# Patient Record
Sex: Male | Born: 1955 | Race: White | Hispanic: No | Marital: Married | Smoking: Never smoker
Health system: Southern US, Community
[De-identification: ages and names within clinical notes are randomized; demographics above are authoritative.]

## PROBLEM LIST (undated history)

## (undated) DIAGNOSIS — N390 Urinary tract infection, site not specified: Secondary | ICD-10-CM

## (undated) DIAGNOSIS — K219 Gastro-esophageal reflux disease without esophagitis: Secondary | ICD-10-CM

## (undated) DIAGNOSIS — D751 Secondary polycythemia: Secondary | ICD-10-CM

## (undated) DIAGNOSIS — E785 Hyperlipidemia, unspecified: Secondary | ICD-10-CM

## (undated) DIAGNOSIS — E0590 Thyrotoxicosis, unspecified without thyrotoxic crisis or storm: Secondary | ICD-10-CM

## (undated) DIAGNOSIS — Z87442 Personal history of urinary calculi: Secondary | ICD-10-CM

## (undated) DIAGNOSIS — B159 Hepatitis A without hepatic coma: Secondary | ICD-10-CM

## (undated) DIAGNOSIS — I10 Essential (primary) hypertension: Secondary | ICD-10-CM

## (undated) HISTORY — DX: Thyrotoxicosis, unspecified without thyrotoxic crisis or storm: E05.90

## (undated) HISTORY — DX: Hyperlipidemia, unspecified: E78.5

## (undated) HISTORY — DX: Gastro-esophageal reflux disease without esophagitis: K21.9

## (undated) HISTORY — DX: Personal history of urinary calculi: Z87.442

## (undated) HISTORY — DX: Secondary polycythemia: D75.1

## (undated) HISTORY — DX: Essential (primary) hypertension: I10

## (undated) HISTORY — DX: Urinary tract infection, site not specified: N39.0

## (undated) HISTORY — DX: Hepatitis a without hepatic coma: B15.9

---

## 1999-03-08 ENCOUNTER — Ambulatory Visit (HOSPITAL_COMMUNITY): Admission: RE | Admit: 1999-03-08 | Discharge: 1999-03-08 | Payer: Self-pay | Admitting: Internal Medicine

## 1999-03-08 ENCOUNTER — Encounter: Payer: Self-pay | Admitting: Internal Medicine

## 1999-03-13 ENCOUNTER — Ambulatory Visit (HOSPITAL_COMMUNITY): Admission: RE | Admit: 1999-03-13 | Discharge: 1999-03-13 | Payer: Self-pay | Admitting: Internal Medicine

## 1999-03-13 ENCOUNTER — Encounter: Payer: Self-pay | Admitting: Internal Medicine

## 1999-03-21 ENCOUNTER — Encounter: Payer: Self-pay | Admitting: Endocrinology

## 1999-03-21 ENCOUNTER — Ambulatory Visit (HOSPITAL_COMMUNITY): Admission: RE | Admit: 1999-03-21 | Discharge: 1999-03-21 | Payer: Self-pay | Admitting: Endocrinology

## 1999-10-17 ENCOUNTER — Encounter (INDEPENDENT_AMBULATORY_CARE_PROVIDER_SITE_OTHER): Payer: Self-pay | Admitting: Specialist

## 1999-10-17 ENCOUNTER — Ambulatory Visit (HOSPITAL_COMMUNITY): Admission: RE | Admit: 1999-10-17 | Discharge: 1999-10-17 | Payer: Self-pay | Admitting: Urology

## 2005-10-27 ENCOUNTER — Ambulatory Visit: Payer: Self-pay | Admitting: Internal Medicine

## 2006-05-07 ENCOUNTER — Ambulatory Visit: Payer: Self-pay | Admitting: Internal Medicine

## 2006-05-12 ENCOUNTER — Ambulatory Visit: Payer: Self-pay | Admitting: Internal Medicine

## 2006-10-12 ENCOUNTER — Encounter (INDEPENDENT_AMBULATORY_CARE_PROVIDER_SITE_OTHER): Payer: Self-pay | Admitting: *Deleted

## 2006-10-12 ENCOUNTER — Ambulatory Visit: Payer: Self-pay | Admitting: Internal Medicine

## 2006-10-12 LAB — CONVERTED CEMR LAB: PSA: 0.89 ng/mL

## 2006-11-17 ENCOUNTER — Ambulatory Visit: Payer: Self-pay | Admitting: Internal Medicine

## 2006-11-17 LAB — CONVERTED CEMR LAB
ALT: 33 units/L (ref 0–40)
AST: 36 units/L (ref 0–37)
Albumin: 4.4 g/dL (ref 3.5–5.2)
Alkaline Phosphatase: 65 units/L (ref 39–117)
Bilirubin, Direct: 0.2 mg/dL (ref 0.0–0.3)
Chol/HDL Ratio, serum: 4.1
Cholesterol: 148 mg/dL (ref 0–200)
HDL: 36.1 mg/dL — ABNORMAL LOW (ref >39.0)
LDL Cholesterol: 94 mg/dL (ref 0–99)
Total Bilirubin: 1.3 mg/dL — ABNORMAL HIGH (ref 0.3–1.2)
Total CK: 379 units/L (ref 7–195)
Total Protein: 7.6 g/dL (ref 6.0–8.3)
Triglyceride fasting, serum: 90 mg/dL (ref 0–149)
VLDL: 18 mg/dL (ref 0–40)

## 2007-07-29 ENCOUNTER — Encounter: Payer: Self-pay | Admitting: *Deleted

## 2007-07-29 DIAGNOSIS — E0590 Thyrotoxicosis, unspecified without thyrotoxic crisis or storm: Secondary | ICD-10-CM

## 2007-07-29 DIAGNOSIS — I10 Essential (primary) hypertension: Secondary | ICD-10-CM | POA: Insufficient documentation

## 2007-07-29 DIAGNOSIS — E785 Hyperlipidemia, unspecified: Secondary | ICD-10-CM | POA: Insufficient documentation

## 2007-07-29 DIAGNOSIS — K219 Gastro-esophageal reflux disease without esophagitis: Secondary | ICD-10-CM

## 2007-07-29 DIAGNOSIS — B159 Hepatitis A without hepatic coma: Secondary | ICD-10-CM | POA: Insufficient documentation

## 2007-07-29 HISTORY — DX: Thyrotoxicosis, unspecified without thyrotoxic crisis or storm: E05.90

## 2007-07-29 HISTORY — DX: Hepatitis a without hepatic coma: B15.9

## 2007-07-29 HISTORY — DX: Hyperlipidemia, unspecified: E78.5

## 2007-07-29 HISTORY — DX: Gastro-esophageal reflux disease without esophagitis: K21.9

## 2007-07-29 HISTORY — DX: Essential (primary) hypertension: I10

## 2008-05-16 ENCOUNTER — Telehealth: Payer: Self-pay | Admitting: Internal Medicine

## 2008-05-17 ENCOUNTER — Ambulatory Visit: Payer: Self-pay | Admitting: Internal Medicine

## 2008-05-18 LAB — CONVERTED CEMR LAB
ALT: 20 units/L (ref 0–53)
AST: 23 units/L (ref 0–37)
Albumin: 4.4 g/dL (ref 3.5–5.2)
Alkaline Phosphatase: 69 units/L (ref 39–117)
Bilirubin, Direct: 0.1 mg/dL (ref 0.0–0.3)
Cholesterol: 137 mg/dL (ref 0–200)
Direct LDL: 74.6 mg/dL
HDL: 30.4 mg/dL — ABNORMAL LOW (ref >39.0)
PSA: 1.03 ng/mL (ref 0.10–4.00)
Total Bilirubin: 1.1 mg/dL (ref 0.3–1.2)
Total CHOL/HDL Ratio: 4.5
Total Protein: 7.3 g/dL (ref 6.0–8.3)
Triglycerides: 289 mg/dL (ref 0–149)
VLDL: 58 mg/dL — ABNORMAL HIGH (ref 0–40)

## 2009-05-09 LAB — HM COLONOSCOPY

## 2009-08-06 ENCOUNTER — Encounter: Payer: Self-pay | Admitting: Internal Medicine

## 2009-08-28 ENCOUNTER — Telehealth: Payer: Self-pay | Admitting: Internal Medicine

## 2009-08-30 ENCOUNTER — Ambulatory Visit: Payer: Self-pay | Admitting: Internal Medicine

## 2009-08-30 LAB — CONVERTED CEMR LAB
ALT: 19 units/L (ref 0–53)
AST: 20 units/L (ref 0–37)
Albumin: 4.4 g/dL (ref 3.5–5.2)
Alkaline Phosphatase: 59 units/L (ref 39–117)
BUN: 21 mg/dL (ref 6–23)
Basophils Absolute: 0.1 10*3/uL (ref 0.0–0.1)
Basophils Relative: 1.4 % (ref 0.0–3.0)
Bilirubin Urine: NEGATIVE
Bilirubin, Direct: 0.2 mg/dL (ref 0.0–0.3)
CO2: 30 meq/L (ref 19–32)
Calcium: 9 mg/dL (ref 8.4–10.5)
Chloride: 102 meq/L (ref 96–112)
Cholesterol: 209 mg/dL — ABNORMAL HIGH (ref 0–200)
Creatinine, Ser: 1.3 mg/dL (ref 0.4–1.5)
Direct LDL: 164.9 mg/dL
Eosinophils Absolute: 0.1 10*3/uL (ref 0.0–0.7)
Eosinophils Relative: 1.9 % (ref 0.0–5.0)
GFR calc non Af Amer: 61.12 mL/min (ref >60)
Glucose, Bld: 99 mg/dL (ref 70–99)
HCT: 49.6 % (ref 39.0–52.0)
HDL: 31.7 mg/dL — ABNORMAL LOW (ref >39.00)
Hemoglobin: 17.1 g/dL — ABNORMAL HIGH (ref 13.0–17.0)
Ketones, ur: NEGATIVE mg/dL
Leukocytes, UA: NEGATIVE
Lymphocytes Relative: 18.9 % (ref 12.0–46.0)
Lymphs Abs: 1.2 10*3/uL (ref 0.7–4.0)
MCHC: 34.4 g/dL (ref 30.0–36.0)
MCV: 89.5 fL (ref 78.0–100.0)
Monocytes Absolute: 0.5 10*3/uL (ref 0.1–1.0)
Monocytes Relative: 8.2 % (ref 3.0–12.0)
Neutro Abs: 4.4 10*3/uL (ref 1.4–7.7)
Neutrophils Relative %: 69.6 % (ref 43.0–77.0)
Nitrite: NEGATIVE
PSA: 1.29 ng/mL (ref 0.10–4.00)
Platelets: 149 10*3/uL — ABNORMAL LOW (ref 150.0–400.0)
Potassium: 4.1 meq/L (ref 3.5–5.1)
RBC: 5.53 M/uL (ref 4.22–5.81)
RDW: 12.8 % (ref 11.5–14.6)
Sodium: 138 meq/L (ref 135–145)
Specific Gravity, Urine: 1.01 (ref 1.000–1.030)
TSH: 1.55 microintl units/mL (ref 0.35–5.50)
Total Bilirubin: 1.1 mg/dL (ref 0.3–1.2)
Total CHOL/HDL Ratio: 7
Total Protein: 7.2 g/dL (ref 6.0–8.3)
Triglycerides: 89 mg/dL (ref 0.0–149.0)
Urine Glucose: NEGATIVE mg/dL
Urobilinogen, UA: 0.2 (ref 0.0–1.0)
VLDL: 17.8 mg/dL (ref 0.0–40.0)
WBC: 6.3 10*3/uL (ref 4.5–10.5)
pH: 6.5 (ref 5.0–8.0)

## 2009-09-03 ENCOUNTER — Ambulatory Visit: Payer: Self-pay | Admitting: Internal Medicine

## 2009-11-05 ENCOUNTER — Ambulatory Visit: Payer: Self-pay | Admitting: Internal Medicine

## 2010-12-22 ENCOUNTER — Encounter: Payer: Self-pay | Admitting: Urology

## 2011-12-19 ENCOUNTER — Other Ambulatory Visit (HOSPITAL_COMMUNITY): Payer: Self-pay | Admitting: Urology

## 2011-12-19 DIAGNOSIS — R310 Gross hematuria: Secondary | ICD-10-CM

## 2011-12-24 ENCOUNTER — Ambulatory Visit (HOSPITAL_COMMUNITY)
Admission: RE | Admit: 2011-12-24 | Discharge: 2011-12-24 | Disposition: A | Discharge status: Home / Self Care | Payer: Self-pay | Source: Ambulatory Visit | Attending: Urology | Admitting: Urology

## 2011-12-24 DIAGNOSIS — K5730 Diverticulosis of large intestine without perforation or abscess without bleeding: Secondary | ICD-10-CM | POA: Insufficient documentation

## 2011-12-24 DIAGNOSIS — Q619 Cystic kidney disease, unspecified: Secondary | ICD-10-CM | POA: Insufficient documentation

## 2011-12-24 DIAGNOSIS — R310 Gross hematuria: Secondary | ICD-10-CM | POA: Insufficient documentation

## 2011-12-24 DIAGNOSIS — N400 Enlarged prostate without lower urinary tract symptoms: Secondary | ICD-10-CM | POA: Insufficient documentation

## 2011-12-24 MED ORDER — IOHEXOL 300 MG/ML  SOLN
125.0000 mL | Freq: Once | INTRAMUSCULAR | Status: AC | PRN
  Administered 2011-12-24: 125 mL via INTRAVENOUS

## 2013-12-21 ENCOUNTER — Telehealth: Payer: Self-pay

## 2013-12-21 ENCOUNTER — Telehealth: Payer: Self-pay | Admitting: Internal Medicine

## 2013-12-21 DIAGNOSIS — Z0000 Encounter for general adult medical examination without abnormal findings: Secondary | ICD-10-CM

## 2013-12-21 NOTE — Telephone Encounter (Signed)
Ok with me 

## 2013-12-21 NOTE — Telephone Encounter (Signed)
CPX labs entered  

## 2013-12-21 NOTE — Telephone Encounter (Signed)
12/21/2013  Pt [place] with [place] for the past few years and would like to re-establish with Dr. Jenny Reichmann.  Please advise.

## 2013-12-26 ENCOUNTER — Other Ambulatory Visit (INDEPENDENT_AMBULATORY_CARE_PROVIDER_SITE_OTHER): Payer: Self-pay

## 2013-12-26 DIAGNOSIS — Z0000 Encounter for general adult medical examination without abnormal findings: Secondary | ICD-10-CM

## 2013-12-26 LAB — URINALYSIS, ROUTINE W REFLEX MICROSCOPIC
Bilirubin Urine: NEGATIVE
Ketones, ur: NEGATIVE
Leukocytes, UA: NEGATIVE
Nitrite: NEGATIVE
Specific Gravity, Urine: 1.02 (ref 1.000–1.030)
URINE GLUCOSE: NEGATIVE
UROBILINOGEN UA: 0.2 (ref 0.0–1.0)
pH: 7 (ref 5.0–8.0)

## 2013-12-26 LAB — CBC WITH DIFFERENTIAL/PLATELET
BASOS ABS: 0 10*3/uL (ref 0.0–0.1)
Basophils Relative: 0.4 % (ref 0.0–3.0)
Eosinophils Absolute: 0.1 10*3/uL (ref 0.0–0.7)
Eosinophils Relative: 1.5 % (ref 0.0–5.0)
HCT: 51.4 % (ref 39.0–52.0)
Hemoglobin: 17.6 g/dL — ABNORMAL HIGH (ref 13.0–17.0)
LYMPHS ABS: 1.3 10*3/uL (ref 0.7–4.0)
Lymphocytes Relative: 20.7 % (ref 12.0–46.0)
MCHC: 34.2 g/dL (ref 30.0–36.0)
MCV: 87.2 fl (ref 78.0–100.0)
MONOS PCT: 10.5 % (ref 3.0–12.0)
Monocytes Absolute: 0.7 10*3/uL (ref 0.1–1.0)
NEUTROS PCT: 66.9 % (ref 43.0–77.0)
Neutro Abs: 4.2 10*3/uL (ref 1.4–7.7)
PLATELETS: 156 10*3/uL (ref 150.0–400.0)
RBC: 5.9 Mil/uL — ABNORMAL HIGH (ref 4.22–5.81)
RDW: 13.6 % (ref 11.5–14.6)
WBC: 6.3 10*3/uL (ref 4.5–10.5)

## 2013-12-26 LAB — BASIC METABOLIC PANEL
BUN: 20 mg/dL (ref 6–23)
CALCIUM: 9.2 mg/dL (ref 8.4–10.5)
CO2: 30 meq/L (ref 19–32)
Chloride: 103 mEq/L (ref 96–112)
Creatinine, Ser: 1.7 mg/dL — ABNORMAL HIGH (ref 0.4–1.5)
GFR: 45.7 mL/min — ABNORMAL LOW (ref >60.00)
Glucose, Bld: 98 mg/dL (ref 70–99)
Potassium: 4.8 mEq/L (ref 3.5–5.1)
SODIUM: 139 meq/L (ref 135–145)

## 2013-12-26 LAB — LIPID PANEL
CHOL/HDL RATIO: 6
Cholesterol: 213 mg/dL — ABNORMAL HIGH (ref 0–200)
HDL: 37.2 mg/dL — ABNORMAL LOW (ref >39.00)
TRIGLYCERIDES: 104 mg/dL (ref 0.0–149.0)
VLDL: 20.8 mg/dL (ref 0.0–40.0)

## 2013-12-26 LAB — HEPATIC FUNCTION PANEL
ALK PHOS: 63 U/L (ref 39–117)
ALT: 17 U/L (ref 0–53)
AST: 21 U/L (ref 0–37)
Albumin: 4.2 g/dL (ref 3.5–5.2)
BILIRUBIN DIRECT: 0.1 mg/dL (ref 0.0–0.3)
TOTAL PROTEIN: 7.3 g/dL (ref 6.0–8.3)
Total Bilirubin: 1 mg/dL (ref 0.3–1.2)

## 2013-12-26 LAB — LDL CHOLESTEROL, DIRECT: Direct LDL: 157.1 mg/dL

## 2013-12-26 LAB — TSH: TSH: 1.67 u[IU]/mL (ref 0.35–5.50)

## 2013-12-26 LAB — PSA: PSA: 1.31 ng/mL (ref 0.10–4.00)

## 2013-12-29 ENCOUNTER — Encounter: Payer: Self-pay | Admitting: Internal Medicine

## 2013-12-29 ENCOUNTER — Ambulatory Visit (INDEPENDENT_AMBULATORY_CARE_PROVIDER_SITE_OTHER): Payer: Self-pay | Admitting: Internal Medicine

## 2013-12-29 VITALS — BP 164/100 | HR 95 | Temp 98.8°F | Ht 67.0 in | Wt 210.0 lb

## 2013-12-29 DIAGNOSIS — N183 Chronic kidney disease, stage 3 unspecified: Secondary | ICD-10-CM | POA: Insufficient documentation

## 2013-12-29 DIAGNOSIS — N289 Disorder of kidney and ureter, unspecified: Secondary | ICD-10-CM

## 2013-12-29 DIAGNOSIS — D45 Polycythemia vera: Secondary | ICD-10-CM | POA: Insufficient documentation

## 2013-12-29 DIAGNOSIS — Z0000 Encounter for general adult medical examination without abnormal findings: Secondary | ICD-10-CM | POA: Insufficient documentation

## 2013-12-29 DIAGNOSIS — D751 Secondary polycythemia: Secondary | ICD-10-CM

## 2013-12-29 DIAGNOSIS — E785 Hyperlipidemia, unspecified: Secondary | ICD-10-CM

## 2013-12-29 DIAGNOSIS — I10 Essential (primary) hypertension: Secondary | ICD-10-CM

## 2013-12-29 DIAGNOSIS — Z23 Encounter for immunization: Secondary | ICD-10-CM

## 2013-12-29 DIAGNOSIS — Z87442 Personal history of urinary calculi: Secondary | ICD-10-CM

## 2013-12-29 DIAGNOSIS — Z0001 Encounter for general adult medical examination with abnormal findings: Secondary | ICD-10-CM | POA: Insufficient documentation

## 2013-12-29 HISTORY — DX: Secondary polycythemia: D75.1

## 2013-12-29 HISTORY — DX: Personal history of urinary calculi: Z87.442

## 2013-12-29 MED ORDER — ASPIRIN EC 81 MG PO TBEC: 81.0000 mg | DELAYED_RELEASE_TABLET | Freq: Every day | ORAL | Status: DC

## 2013-12-29 MED ORDER — LOVASTATIN 40 MG PO TABS: 40.0000 mg | ORAL_TABLET | Freq: Every day | ORAL | Status: DC

## 2013-12-29 MED ORDER — TELMISARTAN-AMLODIPINE 80-5 MG PO TABS: ORAL_TABLET | ORAL | Status: DC

## 2013-12-29 NOTE — Assessment & Plan Note (Signed)
?   Chronic, recent renal u/s ok in [place] per pt, for fu labs as ordered

## 2013-12-29 NOTE — Addendum Note (Signed)
Addended by: Biagio Borg on: 12/29/2013 02:19 PM   Modules accepted: Level of Service

## 2013-12-29 NOTE — Assessment & Plan Note (Signed)
To start lovastatin 40, f/u labs 4 wks

## 2013-12-29 NOTE — Assessment & Plan Note (Signed)
approx 2015 with steroid tx, resolved

## 2013-12-29 NOTE — Assessment & Plan Note (Signed)

## 2013-12-29 NOTE — Patient Instructions (Addendum)
Please start the Aspirin at 81 mg per day (coated only) Please take all new medication as prescribed - the lovastatin 40 mg per day Please re-start the twynsta 80/5 mg - 1 per day Please return in 4 weeks for repeat kidney and cholesterol testing  You will be contacted regarding the referral for: overnight oximetry (probably per [facility])   Please continue your efforts at being more active, low cholesterol diet, and weight control. You are otherwise up to date with prevention measures today.  Your Lab work was OK today  Please return in 1 year for your yearly visit, or sooner if needed, with Lab testing done 3-5 days before

## 2013-12-29 NOTE — Progress Notes (Signed)
Subjective:    Patient ID: Bradley Riley, male    DOB: 10-Jun-1956, 58 y.o.   MRN: [id number]  HPI  Here for wellness and re-establish;  Overall doing ok;  Pt denies CP, worsening SOB, DOE, wheezing, orthopnea, PND, worsening LE edema, palpitations, dizziness or syncope.  Pt denies neurological change such as new headache, facial or extremity weakness.  Pt denies polydipsia, polyuria, or low sugar symptoms. Pt states overall good compliance with treatment and medications, good tolerability, and has been trying to follow lower cholesterol diet.  Pt denies worsening depressive symptoms, suicidal ideation or panic. No fever, night sweats, wt loss, loss of appetite, or other constitutional symptoms.  Pt states good ability with ADL's, has low fall risk, home safety reviewed and adequate, no other significant changes in hearing or vision, and only occasionally active with exercise.  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  Has been taking twynsta 80/5 qd with BP in [place] < 140/90.  Out of med today for several wks.  Looking in to [facility]. Past Medical History  Diagnosis Date  . Hyperlipidemia   . UTI (lower urinary tract infection)   . Other and unspecified hyperlipidemia 07/29/2007    Centricity Description: DYSLIPIDEMIA Qualifier: Diagnosis of  By: Garen Grams   Centricity Description: HYPERLIPIDEMIA Qualifier: Diagnosis of  By: Garen Grams    . HYPERTHYROIDISM 07/29/2007    Qualifier: Diagnosis of  By: Garen Grams    . HYPERTENSION 07/29/2007    Qualifier: Diagnosis of  By: Garen Grams    . 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 GERD 07/29/2007    Qualifier: Diagnosis of  By: Garen Grams    . History of renal stone 12/29/2013   No past  surgical history on file.  reports that he has never smoked. He does not have any smokeless tobacco history on file. He reports that he does not drink alcohol or use illicit drugs. family history includes Hypertension in his father and mother. No Known Allergies No current outpatient prescriptions on file prior to visit.   No current facility-administered medications on file prior to visit.   Review of Systems Constitutional: Negative for diaphoresis, activity change, appetite change or unexpected weight change.  HENT: Negative for hearing loss, ear pain, facial swelling, mouth sores and neck stiffness.   Eyes: Negative for pain, redness and visual disturbance.  Respiratory: Negative for shortness of breath and wheezing.   Cardiovascular: Negative for chest pain and palpitations.  Gastrointestinal: Negative for diarrhea, blood in stool, abdominal distention or other pain Genitourinary: Negative for hematuria, flank pain or change in urine volume.  Musculoskeletal: Negative for myalgias and joint swelling.  Skin: Negative for color change and wound.  Neurological: Negative for syncope and numbness. other than noted Hematological: Negative for adenopathy.  Psychiatric/Behavioral: Negative for hallucinations, self-injury, decreased concentration and agitation.      Objective:   Physical Exam BP 164/100  Pulse 95  Temp(Src) 98.8 F (37.1 C) (Oral)  Ht 5\' 7"  (1.702 m)  Wt 210 lb (95.255 kg)  BMI 32.88 kg/m2  SpO2 97% VS noted,  Constitutional: Pt is oriented to person, place, and time. Appears well-developed and well-nourished. Annabell Sabal Head: Normocephalic and atraumatic.  Right Ear:  External ear normal.  Left Ear: External ear normal.  Nose: Nose normal.  Mouth/Throat: Oropharynx is clear and moist.  Eyes: Conjunctivae and EOM are normal. Pupils are equal, round, and reactive to light.  Neck: Normal range of motion. Neck supple. No JVD present. No tracheal deviation present.    Cardiovascular: Normal rate, regular rhythm, normal heart sounds and intact distal pulses.   Pulmonary/Chest: Effort normal and breath sounds normal.  Abdominal: Soft. Bowel sounds are normal. There is no tenderness. No HSM  Musculoskeletal: Normal range of motion. Exhibits no edema.  Lymphadenopathy:  Has no cervical adenopathy.  Neurological: Pt is alert and oriented to person, place, and time. Pt has normal reflexes. No cranial nerve deficit.  Skin: Skin is warm and dry. No rash noted.  Psychiatric:  Has  normal mood and affect. Behavior is normal.     Assessment & Plan:

## 2013-12-29 NOTE — Assessment & Plan Note (Signed)
Etiology unclear, for ONO with hx of snoring - r/o nocturnal hypxemia

## 2013-12-29 NOTE — Assessment & Plan Note (Signed)
To re-start , f/u labs in 4 wks - r/o worsening renal fxn

## 2013-12-30 ENCOUNTER — Telehealth: Payer: Self-pay | Admitting: Internal Medicine

## 2013-12-30 MED ORDER — AMLODIPINE BESYLATE 5 MG PO TABS: 5.0000 mg | ORAL_TABLET | Freq: Every day | ORAL | Status: DC

## 2013-12-30 MED ORDER — IRBESARTAN 300 MG PO TABS: 300.0000 mg | ORAL_TABLET | Freq: Every day | ORAL | Status: DC

## 2013-12-30 NOTE — Telephone Encounter (Signed)
Relevant patient education mailed to patient.  

## 2013-12-30 NOTE — Telephone Encounter (Signed)
BP  Med changed to 2 generic

## 2013-12-30 NOTE — Telephone Encounter (Signed)
Pt called request different medication for BP [facility] due to Twynsta 80-5 mg is no in stock for [facility]. Pt request for this to be done today if we can. Please advise.

## 2014-01-02 NOTE — Telephone Encounter (Signed)
The patient called and is in need of clarification regarding this medication.   Callback - [phone]

## 2014-01-02 NOTE — Telephone Encounter (Signed)
Returned patient's phone call & informed him that requested RX change had been sent to his pharmacy at [facility] on [address].

## 2014-01-11 ENCOUNTER — Telehealth: Payer: Self-pay | Admitting: Internal Medicine

## 2014-01-11 DIAGNOSIS — G4734 Idiopathic sleep related nonobstructive alveolar hypoventilation: Secondary | ICD-10-CM

## 2014-01-11 NOTE — Telephone Encounter (Signed)
Overnight oximetry shows significant low oxygen at night, could be possibly related to Sleep Apnea  I have referred to Pulmonary as d/w pt last visit  Robin to inform pt

## 2014-01-11 NOTE — Telephone Encounter (Signed)
Called the patient informed of results and referral.

## 2014-01-18 ENCOUNTER — Ambulatory Visit (INDEPENDENT_AMBULATORY_CARE_PROVIDER_SITE_OTHER): Payer: Self-pay | Admitting: Pulmonary Disease

## 2014-01-18 ENCOUNTER — Encounter: Payer: Self-pay | Admitting: Pulmonary Disease

## 2014-01-18 VITALS — BP 172/112 | HR 96 | Ht 67.0 in | Wt 211.0 lb

## 2014-01-18 DIAGNOSIS — R0683 Snoring: Secondary | ICD-10-CM | POA: Insufficient documentation

## 2014-01-18 DIAGNOSIS — D751 Secondary polycythemia: Secondary | ICD-10-CM

## 2014-01-18 DIAGNOSIS — D45 Polycythemia vera: Secondary | ICD-10-CM

## 2014-01-18 DIAGNOSIS — R0989 Other specified symptoms and signs involving the circulatory and respiratory systems: Secondary | ICD-10-CM

## 2014-01-18 DIAGNOSIS — R0609 Other forms of dyspnea: Secondary | ICD-10-CM

## 2014-01-18 NOTE — Assessment & Plan Note (Signed)
He has snoring, sleep disruption, and daytime sleepiness.  He has history of refractory hypertension, and polycythemia.  Recent overnight oximetry showed significant oxygen desaturation.  I am concerned that he has sleep apnea.  We discussed how sleep apnea can affect various health problems including risks for hypertension, cardiovascular disease, and diabetes.  We also discussed how sleep disruption can increase risks for accident, such as while driving.  Weight loss as a means of improving sleep apnea was also reviewed.  Additional treatment options discussed were CPAP therapy, oral appliance, and surgical intervention.  Will arrange for in lab sleep study to further assess.

## 2014-01-18 NOTE — Patient Instructions (Signed)
Will arrange for sleep study Will call to arrange for follow up after sleep study reviewed 

## 2014-01-18 NOTE — Progress Notes (Signed)
Chief Complaint  Patient presents with  . Sleep Consult    referred by Dr. Jenny Reichmann    History of Present Illness: Bradley Riley is a 58 y.o. male for evaluation of sleep problems.  He was recently seen by his PCP.  Lab work showed hemoglobin of 17.6.  He had overnight oximetry which reportedly showed significant oxygen desaturation.  He also has refractory hypertension.  He snores.  He was referred to pulmonary/sleep for further evaluation.  He goes to sleep at midnight.  He falls asleep after 15 minutes.  He wakes up two times to use the bathroom.  He gets out of bed at 9 am.  He feels tired in the morning.  He denies morning headache.  He does not use anything to help him fall sleep or stay awake.  He is not sure if he stops breathing while asleep.  He will get sleepy and fall asleep when watching TV.  He also gets drowsy if he drives for long distances.  He denies sleep walking, sleep talking, bruxism, or nightmares.  There is no history of restless legs.  He denies sleep hallucinations, sleep paralysis, or cataplexy.  The Epworth score is 12 out of 24.   Fahd Galea Markwardt  has a past medical history of Hyperlipidemia; UTI (lower urinary tract infection); Other and unspecified hyperlipidemia (07/29/2007); HYPERTHYROIDISM (07/29/2007); HYPERTENSION (07/29/2007); HEPATITIS A, VIRAL, W/O HEPATIC COMA (07/29/2007); GERD (07/29/2007); History of renal stone (12/29/2013); and Polycythemia (12/29/2013).  Gunther Zawadzki Pam[place]uan  has no past surgical history on file.  Prior to Admission medications   Medication Sig Start Date End Date Taking? Authorizing Provider  aspirin EC 81 MG tablet Take 1 tablet (81 mg total) by mouth daily. 12/29/13  Yes Biagio Borg, MD  irbesartan (AVAPRO) 300 MG tablet Take 1 tablet (300 mg total) by mouth daily. 12/30/13  Yes Biagio Borg, MD  lovastatin (MEVACOR) 40 MG tablet Take 1 tablet (40 mg total) by mouth at bedtime. 12/29/13  Yes Biagio Borg, MD  Telmisartan-Amlodipine (TWYNSTA) 80-5 MG TABS  Take 1 tablet by mouth at bedtime.   Yes Historical Provider, MD    No Known Allergies  His family history includes Hypertension in his father and mother.  He  reports that he has never smoked. He does not have any smokeless tobacco history on file. He reports that he does not drink alcohol or use illicit drugs.  Review of Systems  Constitutional: Negative for fever, chills, diaphoresis, activity change, appetite change, fatigue and unexpected weight change.  HENT: Negative for congestion, dental problem, ear discharge, ear pain, facial swelling, hearing loss, mouth sores, nosebleeds, postnasal drip, rhinorrhea, sinus pressure, sneezing, sore throat, tinnitus, trouble swallowing and voice change.   Eyes: Negative for photophobia, discharge, itching and visual disturbance.  Respiratory: Negative for apnea, cough, choking, chest tightness, shortness of breath, wheezing and stridor.   Cardiovascular: Negative for chest pain, palpitations and leg swelling.  Gastrointestinal: Negative for nausea, vomiting, abdominal pain, constipation, blood in stool and abdominal distention.  Genitourinary: Negative for dysuria, urgency, frequency, hematuria, flank pain, decreased urine volume and difficulty urinating.  Musculoskeletal: Negative for arthralgias, back pain, gait problem, joint swelling, myalgias, neck pain and neck stiffness.  Skin: Negative for color change, pallor and rash.  Neurological: Negative for dizziness, tremors, seizures, syncope, speech difficulty, weakness, light-headedness, numbness and headaches.  Hematological: Negative for adenopathy. Does not bruise/bleed easily.  Psychiatric/Behavioral: Negative for confusion, sleep disturbance and agitation. The patient is not nervous/anxious.  Physical Exam:  General - No distress ENT - No sinus tenderness, no oral exudate, no LAN, no thyromegaly, TM clear, pupils equal/reactive, MP 4, enlarged tongue, scalloped tongue border, high arched  palate Cardiac - s1s2 regular, no murmur, pulses symmetric Chest - No wheeze/rales/dullness, good air entry, normal respiratory excursion Back - No focal tenderness Abd - Soft, non-tender, no organomegaly, + bowel sounds Ext - No edema Neuro - Normal strength, cranial nerves intact Skin - No rashes  Assessment/plan:  Chesley Mires, MD [place] 01/18/2014, 12:13 PM Pager:  [phone] After 3pm call: [phone]  Psych - Normal mood, and behavior

## 2014-01-18 NOTE — Progress Notes (Deleted)
   Subjective:    Patient ID: Bradley Riley, male    DOB: 1956/05/27, 58 y.o.   MRN: [id number]  HPI    Review of Systems  Constitutional: Negative for fever, chills, diaphoresis, activity change, appetite change, fatigue and unexpected weight change.  HENT: Negative for congestion, dental problem, ear discharge, ear pain, facial swelling, hearing loss, mouth sores, nosebleeds, postnasal drip, rhinorrhea, sinus pressure, sneezing, sore throat, tinnitus, trouble swallowing and voice change.   Eyes: Negative for photophobia, discharge, itching and visual disturbance.  Respiratory: Negative for apnea, cough, choking, chest tightness, shortness of breath, wheezing and stridor.   Cardiovascular: Negative for chest pain, palpitations and leg swelling.  Gastrointestinal: Negative for nausea, vomiting, abdominal pain, constipation, blood in stool and abdominal distention.  Genitourinary: Negative for dysuria, urgency, frequency, hematuria, flank pain, decreased urine volume and difficulty urinating.  Musculoskeletal: Negative for arthralgias, back pain, gait problem, joint swelling, myalgias, neck pain and neck stiffness.  Skin: Negative for color change, pallor and rash.  Neurological: Negative for dizziness, tremors, seizures, syncope, speech difficulty, weakness, light-headedness, numbness and headaches.  Hematological: Negative for adenopathy. Does not bruise/bleed easily.  Psychiatric/Behavioral: Negative for confusion, sleep disturbance and agitation. The patient is not nervous/anxious.        Objective:   Physical Exam        Assessment & Plan:

## 2014-01-18 NOTE — Assessment & Plan Note (Signed)
Likely related to nocturnal oxygen desaturation from sleep disordered breathing.  However, if his sleep study is negative, then he will need additional pulmonary testing to assess cause for his nocturnal oxygen desaturation.

## 2014-01-30 ENCOUNTER — Other Ambulatory Visit (INDEPENDENT_AMBULATORY_CARE_PROVIDER_SITE_OTHER): Payer: Self-pay

## 2014-01-30 DIAGNOSIS — I10 Essential (primary) hypertension: Secondary | ICD-10-CM

## 2014-01-30 DIAGNOSIS — E785 Hyperlipidemia, unspecified: Secondary | ICD-10-CM

## 2014-01-30 LAB — HEPATIC FUNCTION PANEL
ALT: 19 U/L (ref 0–53)
AST: 18 U/L (ref 0–37)
Albumin: 4.1 g/dL (ref 3.5–5.2)
Alkaline Phosphatase: 59 U/L (ref 39–117)
BILIRUBIN DIRECT: 0.1 mg/dL (ref 0.0–0.3)
Total Bilirubin: 0.8 mg/dL (ref 0.3–1.2)
Total Protein: 7.4 g/dL (ref 6.0–8.3)

## 2014-01-30 LAB — BASIC METABOLIC PANEL
BUN: 24 mg/dL — AB (ref 6–23)
CO2: 27 mEq/L (ref 19–32)
Calcium: 8.9 mg/dL (ref 8.4–10.5)
Chloride: 104 mEq/L (ref 96–112)
Creatinine, Ser: 1.6 mg/dL — ABNORMAL HIGH (ref 0.4–1.5)
GFR: 47.34 mL/min — AB (ref >60.00)
Glucose, Bld: 99 mg/dL (ref 70–99)
POTASSIUM: 4.4 meq/L (ref 3.5–5.1)
SODIUM: 138 meq/L (ref 135–145)

## 2014-01-30 LAB — LIPID PANEL
CHOL/HDL RATIO: 5
CHOLESTEROL: 143 mg/dL (ref 0–200)
HDL: 29.1 mg/dL — ABNORMAL LOW (ref >39.00)
LDL CALC: 86 mg/dL (ref 0–99)
Triglycerides: 142 mg/dL (ref 0.0–149.0)
VLDL: 28.4 mg/dL (ref 0.0–40.0)

## 2014-01-31 ENCOUNTER — Encounter: Payer: Self-pay | Admitting: Internal Medicine

## 2014-02-01 ENCOUNTER — Encounter: Payer: Self-pay | Admitting: Internal Medicine

## 2014-02-01 ENCOUNTER — Ambulatory Visit (INDEPENDENT_AMBULATORY_CARE_PROVIDER_SITE_OTHER): Payer: Self-pay | Admitting: Internal Medicine

## 2014-02-01 VITALS — BP 140/98 | HR 89 | Temp 99.1°F | Ht 67.0 in | Wt 210.8 lb

## 2014-02-01 DIAGNOSIS — I10 Essential (primary) hypertension: Secondary | ICD-10-CM

## 2014-02-01 DIAGNOSIS — E785 Hyperlipidemia, unspecified: Secondary | ICD-10-CM

## 2014-02-01 DIAGNOSIS — N289 Disorder of kidney and ureter, unspecified: Secondary | ICD-10-CM

## 2014-02-01 MED ORDER — LOVASTATIN 40 MG PO TABS: 40.0000 mg | ORAL_TABLET | Freq: Every day | ORAL | Status: DC

## 2014-02-01 MED ORDER — AMLODIPINE BESYLATE 5 MG PO TABS: 5.0000 mg | ORAL_TABLET | Freq: Every day | ORAL | Status: DC

## 2014-02-01 NOTE — Assessment & Plan Note (Signed)
Concerning but stable overall by history and exam, recent data reviewed with pt, and pt to continue medical treatment as before,  to f/u any worsening symptoms or concerns Lab Results  Component Value Date   WBC 6.3 12/26/2013   HGB 17.6* 12/26/2013   HCT 51.4 12/26/2013   PLT 156.0 12/26/2013   GLUCOSE 99 01/30/2014   CHOL 143 01/30/2014   TRIG 142.0 01/30/2014   HDL 29.10* 01/30/2014   LDLDIRECT 157.1 12/26/2013   LDLCALC 86 01/30/2014   ALT 19 01/30/2014   AST 18 01/30/2014   NA 138 01/30/2014   K 4.4 01/30/2014   CL 104 01/30/2014   CREATININE 1.6* 01/30/2014   BUN 24* 01/30/2014   CO2 27 01/30/2014   TSH 1.67 12/26/2013   PSA 1.31 12/26/2013   D/w pt, ideally should establish care with renal, but [place] is [place] out [place] the [place], plans to see renal while there, to take labs with him

## 2014-02-01 NOTE — Patient Instructions (Signed)
Please take all new medication as prescribed - the amlodipine 5 mg - sent to [facility]  Please continue all other medications as before, and refills have been done if requested. Please have the pharmacy call with any other refills you may need.  Please continue to monitor your blood pressure, and call in 3 wks if still BP > 130/90  Please keep your appointments with your specialists as you have planned  - the sleep test  Please call if you change your mind about seeing Nephrology in [place]  Please return in 6 months, or sooner if needed, with Lab testing done 3-5 days before

## 2014-02-01 NOTE — Assessment & Plan Note (Signed)
stable overall by history and exam, recent data reviewed with pt, and pt to continue medical treatment as before,  to f/u any worsening symptoms or concerns Lab Results  Component Value Date   LDLCALC 86 01/30/2014

## 2014-02-01 NOTE — Progress Notes (Signed)
Pre visit review using our clinic review tool, if applicable. No additional management support is needed unless otherwise documented below in the visit note. 

## 2014-02-01 NOTE — Progress Notes (Signed)
   Subjective:    Patient ID: Bradley Riley, male    DOB: 08/23/1956, 58 y.o.   MRN: [id number]  HPI  Here to f/u, s/p ONO then saw pulm, for sleep study pending.  Tolerating avapro well, but sbp at home often still 140-150, more than when took took twynsta 40-5; Overall good compliance with treatment, and good medicine tolerability, including the statin as well. Pt denies chest pain, increased sob or doe, wheezing, orthopnea, PND, increased LE swelling, palpitations, dizziness or syncope. Past Medical History  Diagnosis Date  . Hyperlipidemia   . UTI (lower urinary tract infection)   . Other and unspecified hyperlipidemia 07/29/2007    Centricity Description: DYSLIPIDEMIA Qualifier: Diagnosis of  By: Garen Grams   Centricity Description: HYPERLIPIDEMIA Qualifier: Diagnosis of  By: Garen Grams    . HYPERTHYROIDISM 07/29/2007    Qualifier: Diagnosis of  By: Garen Grams    . HYPERTENSION 07/29/2007    Qualifier: Diagnosis of  By: Garen Grams    . 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 GERD 07/29/2007    Qualifier: Diagnosis of  By: Garen Grams    . History of renal stone 12/29/2013  . Polycythemia 12/29/2013   No past surgical history on file.  reports that he has never smoked. He does not have any smokeless tobacco history on file. He reports that he does not drink alcohol or use illicit drugs. family history includes Hypertension in his father and mother. No Known Allergies Current Outpatient Prescriptions on File Prior to Visit  Medication Sig Dispense Refill  . aspirin EC 81 MG tablet Take 1 tablet (81 mg total) by mouth daily.  90 tablet  11  . irbesartan (AVAPRO) 300 MG tablet Take 1 tablet (300 mg total) by mouth daily.  90 tablet  3   No current facility-administered medications on file prior to visit.   Review of Systems  Constitutional: Negative for unexpected weight change, or unusual diaphoresis    HENT: Negative for tinnitus.   Eyes: Negative for photophobia and visual disturbance.  Respiratory: Negative for choking and stridor.   Gastrointestinal: Negative for vomiting and blood in stool.  Genitourinary: Negative for hematuria and decreased urine volume.  Musculoskeletal: Negative for acute joint swelling Skin: Negative for color change and wound.  Neurological: Negative for tremors and numbness other than noted  Psychiatric/Behavioral: Negative for decreased concentration or  hyperactivity.       Objective:   Physical Exam BP 140/98  Pulse 89  Temp(Src) 99.1 F (37.3 C) (Oral)  Ht 5\' 7"  (1.702 m)  Wt 210 lb 12 oz (95.596 kg)  BMI 33.00 kg/m2  SpO2 94% VS noted,  Constitutional: Pt appears well-developed and well-nourished.  HENT: Head: NCAT.  Right Ear: External ear normal.  Left Ear: External ear normal.  Eyes: Conjunctivae and EOM are normal. Pupils are equal, round, and reactive to light.  Neck: Normal range of motion. Neck supple.  Cardiovascular: Normal rate and regular rhythm.   Pulmonary/Chest: Effort normal and breath sounds normal.  Abd:  Soft, NT, non-distended, + BS Neurological: Pt is alert. Not confused  Skin: Skin is warm. No erythema.  Psychiatric: Pt behavior is normal. Thought content normal.      Assessment & Plan:

## 2014-02-01 NOTE — Assessment & Plan Note (Signed)
To add amlod 5 qd,cont all other med, f/u BP at home, call in 3 wks for > 130/90,  to f/u any worsening symptoms or concerns BP Readings from Last 3 Encounters:  02/01/14 140/98  01/18/14 172/112  12/29/13 164/100

## 2014-02-15 ENCOUNTER — Encounter (HOSPITAL_BASED_OUTPATIENT_CLINIC_OR_DEPARTMENT_OTHER): Payer: Self-pay

## 2014-09-15 ENCOUNTER — Other Ambulatory Visit: Payer: Self-pay

## 2014-12-31 ENCOUNTER — Other Ambulatory Visit: Payer: Self-pay | Admitting: Internal Medicine

## 2015-01-02 ENCOUNTER — Telehealth: Payer: Self-pay

## 2015-01-02 ENCOUNTER — Encounter: Payer: Self-pay | Admitting: Internal Medicine

## 2015-01-02 ENCOUNTER — Other Ambulatory Visit (INDEPENDENT_AMBULATORY_CARE_PROVIDER_SITE_OTHER): Payer: BLUE CROSS/BLUE SHIELD

## 2015-01-02 ENCOUNTER — Ambulatory Visit (INDEPENDENT_AMBULATORY_CARE_PROVIDER_SITE_OTHER): Payer: BLUE CROSS/BLUE SHIELD | Admitting: Internal Medicine

## 2015-01-02 VITALS — BP 142/100 | HR 88 | Temp 99.2°F | Ht 67.0 in | Wt 212.2 lb

## 2015-01-02 DIAGNOSIS — Z23 Encounter for immunization: Secondary | ICD-10-CM

## 2015-01-02 DIAGNOSIS — E785 Hyperlipidemia, unspecified: Secondary | ICD-10-CM

## 2015-01-02 DIAGNOSIS — Z0000 Encounter for general adult medical examination without abnormal findings: Secondary | ICD-10-CM

## 2015-01-02 LAB — CBC WITH DIFFERENTIAL/PLATELET
BASOS ABS: 0.1 10*3/uL (ref 0.0–0.1)
BASOS PCT: 1 % (ref 0.0–3.0)
Eosinophils Absolute: 0.1 10*3/uL (ref 0.0–0.7)
Eosinophils Relative: 1.3 % (ref 0.0–5.0)
HEMATOCRIT: 52.6 % — AB (ref 39.0–52.0)
Hemoglobin: 18 g/dL (ref 13.0–17.0)
Lymphocytes Relative: 19.8 % (ref 12.0–46.0)
Lymphs Abs: 1.4 10*3/uL (ref 0.7–4.0)
MCHC: 34.3 g/dL (ref 30.0–36.0)
MCV: 86.3 fl (ref 78.0–100.0)
MONOS PCT: 8.7 % (ref 3.0–12.0)
Monocytes Absolute: 0.6 10*3/uL (ref 0.1–1.0)
NEUTROS PCT: 69.2 % (ref 43.0–77.0)
Neutro Abs: 5 10*3/uL (ref 1.4–7.7)
PLATELETS: 177 10*3/uL (ref 150.0–400.0)
RBC: 6.09 Mil/uL — ABNORMAL HIGH (ref 4.22–5.81)
RDW: 14 % (ref 11.5–15.5)
WBC: 7.2 10*3/uL (ref 4.0–10.5)

## 2015-01-02 LAB — HEPATIC FUNCTION PANEL
ALBUMIN: 4.4 g/dL (ref 3.5–5.2)
ALK PHOS: 59 U/L (ref 39–117)
ALT: 14 U/L (ref 0–53)
AST: 18 U/L (ref 0–37)
BILIRUBIN DIRECT: 0.2 mg/dL (ref 0.0–0.3)
Total Bilirubin: 1.1 mg/dL (ref 0.2–1.2)
Total Protein: 7.2 g/dL (ref 6.0–8.3)

## 2015-01-02 LAB — PSA: PSA: 2.39 ng/mL (ref 0.10–4.00)

## 2015-01-02 LAB — BASIC METABOLIC PANEL
BUN: 23 mg/dL (ref 6–23)
CALCIUM: 9.3 mg/dL (ref 8.4–10.5)
CO2: 24 mEq/L (ref 19–32)
Chloride: 104 mEq/L (ref 96–112)
Creatinine, Ser: 1.64 mg/dL — ABNORMAL HIGH (ref 0.40–1.50)
GFR: 45.86 mL/min — ABNORMAL LOW (ref >60.00)
Glucose, Bld: 90 mg/dL (ref 70–99)
POTASSIUM: 4.7 meq/L (ref 3.5–5.1)
SODIUM: 135 meq/L (ref 135–145)

## 2015-01-02 LAB — URINALYSIS, ROUTINE W REFLEX MICROSCOPIC
BILIRUBIN URINE: NEGATIVE
KETONES UR: NEGATIVE
LEUKOCYTES UA: NEGATIVE
Nitrite: NEGATIVE
PH: 6 (ref 5.0–8.0)
SPECIFIC GRAVITY, URINE: 1.025 (ref 1.000–1.030)
UROBILINOGEN UA: 0.2 (ref 0.0–1.0)
Urine Glucose: NEGATIVE

## 2015-01-02 LAB — LIPID PANEL
CHOL/HDL RATIO: 6
Cholesterol: 209 mg/dL — ABNORMAL HIGH (ref 0–200)
HDL: 35.9 mg/dL — AB (ref >39.00)
LDL Cholesterol: 144 mg/dL — ABNORMAL HIGH (ref 0–99)
NonHDL: 173.1
TRIGLYCERIDES: 148 mg/dL (ref 0.0–149.0)
VLDL: 29.6 mg/dL (ref 0.0–40.0)

## 2015-01-02 LAB — TSH: TSH: 2.04 u[IU]/mL (ref 0.35–4.50)

## 2015-01-02 MED ORDER — ATORVASTATIN CALCIUM 10 MG PO TABS: 10.0000 mg | ORAL_TABLET | Freq: Every day | ORAL | Status: DC

## 2015-01-02 MED ORDER — IRBESARTAN 300 MG PO TABS: 300.0000 mg | ORAL_TABLET | Freq: Every day | ORAL | Status: DC

## 2015-01-02 MED ORDER — AMLODIPINE BESYLATE 5 MG PO TABS: 5.0000 mg | ORAL_TABLET | Freq: Every day | ORAL | Status: AC

## 2015-01-02 NOTE — Progress Notes (Signed)
Subjective:    Patient ID: Bradley Riley, male    DOB: February 22, 1956, 59 y.o.   MRN: [id number]  HPI   Here for wellness and f/u;  Overall doing ok;  Pt denies CP, worsening SOB, DOE, wheezing, orthopnea, PND, worsening LE edema, palpitations, dizziness or syncope.  Pt denies neurological change such as new headache, facial or extremity weakness.  Pt denies polydipsia, polyuria, or low sugar symptoms. Pt states overall good compliance with treatment and medications, good tolerability, and has been trying to follow lower cholesterol diet.  Pt denies worsening depressive symptoms, suicidal ideation or panic. No fever, night sweats, wt loss, loss of appetite, or other constitutional symptoms.  Pt states good ability with ADL's, has low fall risk, home safety reviewed and adequate, no other significant changes in hearing or vision, and only occasionally active with exercise.  Out of BP med x 2 days, ran out of refills. COuld not tolerate lovastatin due to reduced appetite.  Ok to change to [employer].  Wt Readings from Last 3 Encounters:  01/02/15 212 lb 4 oz (96.276 kg)  02/01/14 210 lb 12 oz (95.596 kg)  01/18/14 211 lb (95.709 kg)   Past Medical History  Diagnosis Date  . Hyperlipidemia   . UTI (lower urinary tract infection)   . Other and unspecified hyperlipidemia 07/29/2007    Centricity Description: DYSLIPIDEMIA Qualifier: Diagnosis of  By: Garen Grams   Centricity Description: HYPERLIPIDEMIA Qualifier: Diagnosis of  By: Garen Grams    . HYPERTHYROIDISM 07/29/2007    Qualifier: Diagnosis of  By: Garen Grams    . HYPERTENSION 07/29/2007    Qualifier: Diagnosis of  By: Garen Grams    . 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 GERD 07/29/2007    Qualifier: Diagnosis of  By: Garen Grams    . History of renal stone 12/29/2013  . Polycythemia 12/29/2013   No past surgical history on file.  reports that he has never smoked.  He does not have any smokeless tobacco history on file. He reports that he does not drink alcohol or use illicit drugs. family history includes Hypertension in his father and mother. No Known Allergies Current Outpatient Prescriptions on File Prior to Visit  Medication Sig Dispense Refill  . aspirin EC 81 MG tablet Take 1 tablet (81 mg total) by mouth daily. 90 tablet 11   No current facility-administered medications on file prior to visit.   Review of Systems Constitutional: Negative for increased diaphoresis, other activity, appetite or other siginficant weight change  HENT: Negative for worsening hearing loss, ear pain, facial swelling, mouth sores and neck stiffness.   Eyes: Negative for other worsening pain, redness or visual disturbance.  Respiratory: Negative for shortness of breath and wheezing.   Cardiovascular: Negative for chest pain and palpitations.  Gastrointestinal: Negative for diarrhea, blood in stool, abdominal distention or other pain Genitourinary: Negative for hematuria, flank pain or change in urine volume.  Musculoskeletal: Negative for myalgias or other joint complaints.  Skin: Negative for color change and wound.  Neurological: Negative for syncope and numbness. other than noted Hematological: Negative for adenopathy. or other swelling Psychiatric/Behavioral: Negative for hallucinations, self-injury, decreased concentration or other worsening agitation.      Objective:   Physical Exam BP 142/100 mmHg  Pulse 88  Temp(Src) 99.2 F (37.3 C) (Oral)  Ht 5\' 7"  (1.702 m)  Wt 212 lb 4 oz (96.276 kg)  BMI 33.24 kg/m2  SpO2 95% VS noted,  Constitutional: Pt is oriented to person, place, and time. Appears well-developed and well-nourished.  Head: Normocephalic and atraumatic.  Right Ear: External ear normal.  Left Ear: External ear normal.  Nose: Nose normal.  Mouth/Throat: Oropharynx is clear and moist.  Eyes: Conjunctivae and EOM are normal. Pupils are equal,  round, and reactive to light.  Neck: Normal range of motion. Neck supple. No JVD present. No tracheal deviation present.  Cardiovascular: Normal rate, regular rhythm, normal heart sounds and intact distal pulses.   Pulmonary/Chest: Effort normal and breath sounds without rales or wheezing  Abdominal: Soft. Bowel sounds are normal. NT. No HSM  Musculoskeletal: Normal range of motion. Exhibits no edema.  Lymphadenopathy:  Has no cervical adenopathy.  Neurological: Pt is alert and oriented to person, place, and time. Pt has normal reflexes. No cranial nerve deficit. Motor grossly intact Skin: Skin is warm and dry. No rash noted.  Psychiatric:  Has normal mood and affect. Behavior is normal.    BP Readings from Last 3 Encounters:  01/02/15 142/100  02/01/14 140/98  01/18/14 172/112       Assessment & Plan:

## 2015-01-02 NOTE — Telephone Encounter (Signed)
Critical lab result Hemoglobin 18.0

## 2015-01-02 NOTE — Patient Instructions (Addendum)
You had the flu shot today  Your EKG was [facility] today  OK to start the lipitor 10 mg  Please continue all other medications as before, and refills have been done if requested.  Please have the pharmacy call with any other refills you may need.  Please continue your efforts at being more active, low cholesterol diet, and weight control.  You are otherwise up to date with prevention measures today.  Please keep your appointments with your specialists as you may have planned  Please go to the LAB in the Basement (turn left off the elevator) for the tests to be done today  You will be contacted by phone if any changes need to be made immediately.  Otherwise, you will receive a letter about your results with an explanation, but please check with MyChart first.  Please remember to sign up for MyChart if you have not done so, as this will be important to you in the future with finding out test results, communicating by private email, and scheduling acute appointments online when needed.  Please return in 1 year for your yearly visit, or sooner if needed, with Lab testing done 3-5 days before

## 2015-01-02 NOTE — Addendum Note (Signed)
Addended by: Biagio Borg on: 01/02/2015 05:18 PM   Modules accepted: Miquel Dunn

## 2015-01-02 NOTE — Progress Notes (Signed)
Pre visit review using our clinic review tool, if applicable. No additional management support is needed unless otherwise documented below in the visit note. 

## 2015-01-02 NOTE — Assessment & Plan Note (Signed)
Ok to change to lipitor 10

## 2015-01-02 NOTE — Assessment & Plan Note (Addendum)

## 2015-01-03 ENCOUNTER — Other Ambulatory Visit: Payer: Self-pay | Admitting: Internal Medicine

## 2015-01-03 DIAGNOSIS — D751 Secondary polycythemia: Secondary | ICD-10-CM

## 2015-01-11 ENCOUNTER — Telehealth: Payer: Self-pay | Admitting: Internal Medicine

## 2015-01-11 NOTE — Telephone Encounter (Signed)
PT CONFIRMED APPT ON 01/29/15 AT 11AM W/MOHAMED DX: POLYCYTHEMIA REFERRING DR. Jenny Reichmann CHART DELIVERED 01/11/15

## 2015-01-26 ENCOUNTER — Other Ambulatory Visit: Payer: Self-pay | Admitting: Medical Oncology

## 2015-01-26 DIAGNOSIS — D751 Secondary polycythemia: Secondary | ICD-10-CM

## 2015-01-29 ENCOUNTER — Encounter: Payer: Self-pay | Admitting: Internal Medicine

## 2015-01-29 ENCOUNTER — Telehealth: Payer: Self-pay | Admitting: Internal Medicine

## 2015-01-29 ENCOUNTER — Other Ambulatory Visit: Payer: Self-pay | Admitting: Internal Medicine

## 2015-01-29 ENCOUNTER — Ambulatory Visit (HOSPITAL_BASED_OUTPATIENT_CLINIC_OR_DEPARTMENT_OTHER): Payer: BLUE CROSS/BLUE SHIELD | Admitting: Internal Medicine

## 2015-01-29 ENCOUNTER — Other Ambulatory Visit (HOSPITAL_BASED_OUTPATIENT_CLINIC_OR_DEPARTMENT_OTHER): Payer: BLUE CROSS/BLUE SHIELD

## 2015-01-29 ENCOUNTER — Ambulatory Visit: Payer: BLUE CROSS/BLUE SHIELD

## 2015-01-29 VITALS — BP 142/94 | HR 100 | Temp 99.0°F | Resp 19 | Ht 67.0 in | Wt 210.3 lb

## 2015-01-29 DIAGNOSIS — D751 Secondary polycythemia: Secondary | ICD-10-CM

## 2015-01-29 DIAGNOSIS — R718 Other abnormality of red blood cells: Secondary | ICD-10-CM

## 2015-01-29 DIAGNOSIS — N289 Disorder of kidney and ureter, unspecified: Secondary | ICD-10-CM

## 2015-01-29 LAB — COMPREHENSIVE METABOLIC PANEL (CC13)
ALBUMIN: 3.8 g/dL (ref 3.5–5.0)
ALT: 13 U/L (ref 0–55)
ANION GAP: 8 meq/L (ref 3–11)
AST: 17 U/L (ref 5–34)
Alkaline Phosphatase: 57 U/L (ref 40–150)
BUN: 23.9 mg/dL (ref 7.0–26.0)
CALCIUM: 8.9 mg/dL (ref 8.4–10.4)
CHLORIDE: 105 meq/L (ref 98–109)
CO2: 25 meq/L (ref 22–29)
CREATININE: 1.7 mg/dL — AB (ref 0.7–1.3)
EGFR: 44 mL/min/{1.73_m2} — AB (ref >90)
GLUCOSE: 132 mg/dL (ref 70–140)
POTASSIUM: 4.6 meq/L (ref 3.5–5.1)
Sodium: 137 mEq/L (ref 136–145)
Total Bilirubin: 1.02 mg/dL (ref 0.20–1.20)
Total Protein: 7.1 g/dL (ref 6.4–8.3)

## 2015-01-29 LAB — CBC WITH DIFFERENTIAL/PLATELET
BASO%: 1 % (ref 0.0–2.0)
Basophils Absolute: 0.1 10*3/uL (ref 0.0–0.1)
EOS ABS: 0.1 10*3/uL (ref 0.0–0.5)
EOS%: 1.5 % (ref 0.0–7.0)
HEMATOCRIT: 51.1 % — AB (ref 38.4–49.9)
HGB: 16.6 g/dL (ref 13.0–17.1)
LYMPH%: 14.8 % (ref 14.0–49.0)
MCH: 28.7 pg (ref 27.2–33.4)
MCHC: 32.5 g/dL (ref 32.0–36.0)
MCV: 88.3 fL (ref 79.3–98.0)
MONO#: 1 10*3/uL — AB (ref 0.1–0.9)
MONO%: 12.8 % (ref 0.0–14.0)
NEUT#: 5.2 10*3/uL (ref 1.5–6.5)
NEUT%: 69.9 % (ref 39.0–75.0)
PLATELETS: 158 10*3/uL (ref 140–400)
RBC: 5.79 10*6/uL (ref 4.20–5.82)
RDW: 13.9 % (ref 11.0–14.6)
WBC: 7.5 10*3/uL (ref 4.0–10.3)
lymph#: 1.1 10*3/uL (ref 0.9–3.3)

## 2015-01-29 LAB — IRON AND TIBC CHCC
%SAT: 15 % — AB (ref 20–55)
IRON: 44 ug/dL (ref 42–163)
TIBC: 300 ug/dL (ref 202–409)
UIBC: 256 ug/dL (ref 117–376)

## 2015-01-29 LAB — FERRITIN CHCC: Ferritin: 108 ng/ml (ref 22–316)

## 2015-01-29 NOTE — Progress Notes (Signed)
[place] [phone]   Fax:([phone]  CONSULT NOTE  REFERRING PHYSICIAN: Dr. Cathlean Cower  REASON FOR CONSULTATION:  59 years old white male with polycythemia  HPI Bradley Riley is a 59 y.o. male with past medical history significant for hypertension, dyslipidemia, GERD, mild renal insufficiency as well as low back pain. The patient was seen recently by his primary care physician Dr. Jenny Reichmann for routine physical examination. CBC performed on 01/02/2015 showed elevated hemoglobin of 18.0 and hematocrit 52.6%. Previous CBC on 12/26/2013 showed elevated hemoglobin of 17.6 and hematocrit 51.4%. The patient has been a frequent donor to the [employer] every 6 months but not recently. He always felt better after blood donation. He is a nonsmoker and not currently on any hormonal therapy. He is not on any over-the-counter supplements. His diet has improved recently and he eats red meat only twice a week. He donated one unit of blood to the [employer] after his last blood work.  The patient has no complaints today. He denied having any significant weight loss or night sweats. He denied having any chest pain, shortness of breath, cough or hemoptysis. He denied having any nausea or vomiting. The patient denied having any headache or visual changes. Family history significant for a mother and father with hypertension. He has a brother who died from leukemia. The patient is married and has 4 daughters ranging in age from 35 to 34. The patient owns real estate business. He has no history of smoking, alcohol or drug abuse.  HPI  Past Medical History  Diagnosis Date  . Hyperlipidemia   . UTI (lower urinary tract infection)   . Other and unspecified hyperlipidemia 07/29/2007    Centricity Description: DYSLIPIDEMIA Qualifier: Diagnosis of  By: Garen Grams   Centricity Description: HYPERLIPIDEMIA Qualifier: Diagnosis of  By: Garen Grams    . HYPERTHYROIDISM 07/29/2007   Qualifier: Diagnosis of  By: Garen Grams    . HYPERTENSION 07/29/2007    Qualifier: Diagnosis of  By: Garen Grams    . 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 GERD 07/29/2007    Qualifier: Diagnosis of  By: Garen Grams    . History of renal stone 12/29/2013  . Polycythemia 12/29/2013    History reviewed. No pertinent past surgical history.  Family History  Problem Relation Age of Onset  . Hypertension Mother   . Hypertension Father     Social History History  Substance Use Topics  . Smoking status: Never Smoker   . Smokeless tobacco: Not on file  . Alcohol Use: No    No Known Allergies  Current Outpatient Prescriptions  Medication Sig Dispense Refill  . amLODipine (NORVASC) 5 MG tablet Take 1 tablet (5 mg total) by mouth daily. 90 tablet 3  . aspirin EC 81 MG tablet Take 1 tablet (81 mg total) by mouth daily. 90 tablet 11  . atorvastatin (LIPITOR) 10 MG tablet Take 1 tablet (10 mg total) by mouth daily. 90 tablet 3  . irbesartan (AVAPRO) 300 MG tablet Take 1 tablet (300 mg total) by mouth daily. 90 tablet 3   No current facility-administered medications for this visit.    Review of Systems  Constitutional: negative Eyes: negative Ears, nose, mouth, throat, and face: negative Respiratory: negative Cardiovascular: negative Gastrointestinal: negative Genitourinary:negative Integument/breast: negative Hematologic/lymphatic: negative Musculoskeletal:negative Neurological: negative Behavioral/Psych: negative Endocrine: negative Allergic/Immunologic: negative  Physical Exam  YKD:XIPJA, healthy, no distress, well nourished and well developed SKIN: skin color, texture, turgor are normal, no rashes or significant lesions HEAD: Normocephalic, No masses, lesions, tenderness or abnormalities EYES: normal, PERRLA EARS: External ears normal, Canals clear OROPHARYNX:no exudate, no erythema and lips,  buccal mucosa, and tongue normal  NECK: supple, no adenopathy, no JVD LYMPH:  no palpable lymphadenopathy, no hepatosplenomegaly LUNGS: clear to auscultation , and palpation HEART: regular rate & rhythm, no murmurs and no gallops ABDOMEN:abdomen soft, non-tender, normal bowel sounds and no masses or organomegaly BACK: Back symmetric, no curvature., No CVA tenderness EXTREMITIES:no joint deformities, effusion, or inflammation, no edema, no skin discoloration, no clubbing  NEURO: alert & oriented x 3 with fluent speech, no focal motor/sensory deficits  PERFORMANCE STATUS: ECOG 0  LABORATORY DATA: Lab Results  Component Value Date   WBC 7.5 01/29/2015   HGB 16.6 01/29/2015   HCT 51.1* 01/29/2015   MCV 88.3 01/29/2015   PLT 158 01/29/2015      Chemistry      Component Value Date/Time   NA 137 01/29/2015 1056   NA 135 01/02/2015 1351   K 4.6 01/29/2015 1056   K 4.7 01/02/2015 1351   CL 104 01/02/2015 1351   CO2 25 01/29/2015 1056   CO2 24 01/02/2015 1351   BUN 23.9 01/29/2015 1056   BUN 23 01/02/2015 1351   CREATININE 1.7* 01/29/2015 1056   CREATININE 1.64* 01/02/2015 1351      Component Value Date/Time   CALCIUM 8.9 01/29/2015 1056   CALCIUM 9.3 01/02/2015 1351   ALKPHOS 57 01/29/2015 1056   ALKPHOS 59 01/02/2015 1351   AST 17 01/29/2015 1056   AST 18 01/02/2015 1351   ALT 13 01/29/2015 1056   ALT 14 01/02/2015 1351   BILITOT 1.02 01/29/2015 1056   BILITOT 1.1 01/02/2015 1351       RADIOGRAPHIC STUDIES: No results found.  ASSESSMENT: This is a very pleasant 59 years old white male presented for evaluation of persistent polycythemia suspicious for polycythemia vera in a never smoker patient with no history of pulmonary or cardiac abnormalities and not on any hormonal therapy. He has a family history of leukemia in his brother.   PLAN: I had a lengthy discussion with the patient today about his condition and treatment options. I ordered several studies for  evaluation of his polycythemia including repeat CBC, comprehensive metabolic panel, LDH, serum erythropoietin, iron study, ferritin as well as JAK2 mutation. The patient had a recent phlebotomy at the [facility] 3 weeks ago. His hematocrit is down to 51.1%. I will see him back for follow-up visit in 3 weeks for reevaluation and repeat CBC and discussion of his treatment options based on the pending lab results. The patient also has persistent mild renal insufficiency probably secondary to hypertension. I recommended for him to have a referral from his primary care physician to see a nephrologist for further evaluation. He was advised to call immediately if he has any concerning symptoms in the interval.  The patient voices understanding of current disease status and treatment options and is in agreement with the current care plan.  All questions were answered. The patient knows to call the clinic with any problems, questions or concerns. We can certainly see the patient much sooner if necessary.  Thank you so much for allowing me to participate in the care of [place]. I will continue to follow up the patient with you and assist in his care.  I spent 40  minutes counseling the patient face to face. The total time spent in the appointment was 60 minutes.  Disclaimer: This note was dictated with voice recognition software. Similar sounding words can inadvertently be transcribed and may not be corrected upon review.   Kemarion Abbey K. January 29, 2015, 12:27 PM

## 2015-01-29 NOTE — Progress Notes (Signed)
Checked in new pt with no financial concerns at this time.  Pt states he's here for a hematology concern so financial assistance may not be needed but he has Raquel's card for any billing questions or concerns.

## 2015-01-29 NOTE — Telephone Encounter (Signed)
s.w. pt and advised on March appt...pt ok adn aware.....mailed pt appt sched/avs and letter

## 2015-01-31 LAB — ERYTHROPOIETIN: Erythropoietin: 9.1 m[IU]/mL (ref 2.6–18.5)

## 2015-02-20 ENCOUNTER — Encounter: Payer: Self-pay | Admitting: Internal Medicine

## 2015-02-20 ENCOUNTER — Telehealth: Payer: Self-pay | Admitting: *Deleted

## 2015-02-20 ENCOUNTER — Telehealth: Payer: Self-pay | Admitting: Internal Medicine

## 2015-02-20 ENCOUNTER — Other Ambulatory Visit (HOSPITAL_BASED_OUTPATIENT_CLINIC_OR_DEPARTMENT_OTHER): Payer: BLUE CROSS/BLUE SHIELD

## 2015-02-20 ENCOUNTER — Ambulatory Visit (HOSPITAL_BASED_OUTPATIENT_CLINIC_OR_DEPARTMENT_OTHER): Payer: BLUE CROSS/BLUE SHIELD

## 2015-02-20 ENCOUNTER — Ambulatory Visit (HOSPITAL_BASED_OUTPATIENT_CLINIC_OR_DEPARTMENT_OTHER): Payer: BLUE CROSS/BLUE SHIELD | Admitting: Internal Medicine

## 2015-02-20 VITALS — BP 137/97 | HR 99 | Temp 99.2°F | Resp 21 | Ht 67.0 in | Wt 210.8 lb

## 2015-02-20 DIAGNOSIS — D751 Secondary polycythemia: Secondary | ICD-10-CM

## 2015-02-20 DIAGNOSIS — N289 Disorder of kidney and ureter, unspecified: Secondary | ICD-10-CM

## 2015-02-20 LAB — CBC WITH DIFFERENTIAL/PLATELET
BASO%: 0.6 % (ref 0.0–2.0)
Basophils Absolute: 0 10*3/uL (ref 0.0–0.1)
EOS%: 1.8 % (ref 0.0–7.0)
Eosinophils Absolute: 0.1 10*3/uL (ref 0.0–0.5)
HEMATOCRIT: 51.4 % — AB (ref 38.4–49.9)
HGB: 17.2 g/dL — ABNORMAL HIGH (ref 13.0–17.1)
LYMPH%: 18.3 % (ref 14.0–49.0)
MCH: 29.9 pg (ref 27.2–33.4)
MCHC: 33.5 g/dL (ref 32.0–36.0)
MCV: 89.4 fL (ref 79.3–98.0)
MONO#: 0.6 10*3/uL (ref 0.1–0.9)
MONO%: 8.9 % (ref 0.0–14.0)
NEUT%: 70.4 % (ref 39.0–75.0)
NEUTROS ABS: 4.7 10*3/uL (ref 1.5–6.5)
Platelets: 152 10*3/uL (ref 140–400)
RBC: 5.75 10*6/uL (ref 4.20–5.82)
RDW: 13.8 % (ref 11.0–14.6)
WBC: 6.7 10*3/uL (ref 4.0–10.3)
lymph#: 1.2 10*3/uL (ref 0.9–3.3)

## 2015-02-20 NOTE — Patient Instructions (Signed)

## 2015-02-20 NOTE — Progress Notes (Signed)
Phlebotomy procedure performed. 535 grams taken from patient (R)AC. Patient observed and discharged in stable condition. Patient provided drinks/snacks post phlebotomy.

## 2015-02-20 NOTE — Telephone Encounter (Signed)
Pt confirmed labs/ov per 03/22 POF, gave pt AVS and calendar..... KJ, sent msg to add Phlebotomy

## 2015-02-20 NOTE — Progress Notes (Signed)
Phlebotomy performed on patient. 535 grams

## 2015-02-20 NOTE — Telephone Encounter (Signed)
Sent msg to pt on my chart confirming lab moved closer to Phlebotomy and other D/T added... KJ, also mailed schedule

## 2015-02-20 NOTE — Telephone Encounter (Signed)
Per staff message and POF I have scheduled appts. Advised scheduler of appts. JMW  

## 2015-02-20 NOTE — Progress Notes (Signed)
[place] [phone]   Fax:(336) [phone]  OFFICE PROGRESS NOTE  Cathlean Cower, MD [place] [address]  DIAGNOSIS: Polycythemia questionable for polycythemia vera with negative JAK- 2 mutation  PRIOR THERAPY: None  CURRENT THERAPY: Phlebotomy on as-needed basis.  INTERVAL HISTORY: Bradley Riley 59 y.o. male returns to the clinic today for follow-up visit. The patient is feeling fine today was no specific complaints. He denied having any significant chest pain, shortness of breath, cough or hemoptysis. Has no significant weight loss or night sweats. The patient denied having any fever or chills, no nausea or vomiting. He has no headache or visual changes. He had several studies for evaluation of the persistent polycythemia including ferritin which was normal at 108, serum iron 44, total iron binding capacity 300, serum erythropoietin 9.1, negative JAK 2 mutation. The patient is here today for evaluation and discussion of his lab results and recommendation regarding his condition.  MEDICAL HISTORY: Past Medical History  Diagnosis Date  . Hyperlipidemia   . UTI (lower urinary tract infection)   . Other and unspecified hyperlipidemia 07/29/2007    Centricity Description: DYSLIPIDEMIA Qualifier: Diagnosis of  By: Garen Grams   Centricity Description: HYPERLIPIDEMIA Qualifier: Diagnosis of  By: Garen Grams    . HYPERTHYROIDISM 07/29/2007    Qualifier: Diagnosis of  By: Garen Grams    . HYPERTENSION 07/29/2007    Qualifier: Diagnosis of  By: Garen Grams    . 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 GERD 07/29/2007    Qualifier: Diagnosis of  By: Garen Grams    . History of renal stone 12/29/2013  . Polycythemia 12/29/2013    ALLERGIES:  has No Known Allergies.  MEDICATIONS:  Current Outpatient Prescriptions  Medication Sig Dispense Refill  . amLODipine  (NORVASC) 5 MG tablet Take 1 tablet (5 mg total) by mouth daily. 90 tablet 3  . aspirin EC 81 MG tablet Take 1 tablet (81 mg total) by mouth daily. 90 tablet 11  . atorvastatin (LIPITOR) 10 MG tablet Take 1 tablet (10 mg total) by mouth daily. 90 tablet 3  . irbesartan (AVAPRO) 300 MG tablet Take 1 tablet (300 mg total) by mouth daily. 90 tablet 3   No current facility-administered medications for this visit.    SURGICAL HISTORY: History reviewed. No pertinent past surgical history.  REVIEW OF SYSTEMS:  A comprehensive review of systems was negative.   PHYSICAL EXAMINATION: General appearance: alert, cooperative and no distress Head: Normocephalic, without obvious abnormality, atraumatic Neck: no adenopathy, no JVD, supple, symmetrical, trachea midline and thyroid not enlarged, symmetric, no tenderness/mass/nodules Lymph nodes: Cervical, supraclavicular, and axillary nodes normal. Resp: clear to auscultation bilaterally Back: symmetric, no curvature. ROM normal. No CVA tenderness. Cardio: regular rate and rhythm, S1, S2 normal, no murmur, click, rub or gallop GI: soft, non-tender; bowel sounds normal; no masses,  no organomegaly Extremities: extremities normal, atraumatic, no cyanosis or edema  ECOG PERFORMANCE STATUS: 0 - Asymptomatic  Blood pressure 137/97, pulse 99, temperature 99.2 F (37.3 C), temperature source Oral, resp. rate 21, height 5\' 7"  (1.702 m), weight 210 lb 12.8 oz (95.618 kg), SpO2 96 %.  LABORATORY DATA: Lab Results  Component Value Date   WBC 6.7 02/20/2015   HGB 17.2* 02/20/2015   HCT 51.4* 02/20/2015   MCV 89.4 02/20/2015   PLT 152 02/20/2015  Chemistry      Component Value Date/Time   NA 137 01/29/2015 1056   NA 135 01/02/2015 1351   K 4.6 01/29/2015 1056   K 4.7 01/02/2015 1351   CL 104 01/02/2015 1351   CO2 25 01/29/2015 1056   CO2 24 01/02/2015 1351   BUN 23.9 01/29/2015 1056   BUN 23 01/02/2015 1351   CREATININE 1.7* 01/29/2015 1056    CREATININE 1.64* 01/02/2015 1351      Component Value Date/Time   CALCIUM 8.9 01/29/2015 1056   CALCIUM 9.3 01/02/2015 1351   ALKPHOS 57 01/29/2015 1056   ALKPHOS 59 01/02/2015 1351   AST 17 01/29/2015 1056   AST 18 01/02/2015 1351   ALT 13 01/29/2015 1056   ALT 14 01/02/2015 1351   BILITOT 1.02 01/29/2015 1056   BILITOT 1.1 01/02/2015 1351       RADIOGRAPHIC STUDIES: No results found.  ASSESSMENT AND PLAN: This is a very pleasant 59 years old white male with persistent polycythemia suspicious for polycythemia vera in the absence of JAK 2 mutation as the patient has no other underlying etiology to explain reactive erythrocytosis with family history of leukemia. I recommended for the patient to proceed with phlebotomy to keep his hematocrit around 45%. He is in agreement with the current plan and will receive the first phlebotomy today and every 2 weeks for the next month. I will see him back for follow-up visit in one month's for reevaluation and repeat blood work. The patient also has mild renal insufficiency, I recommended for him to get a referral from his primary care physician to see a nephrologist for this problem. He was advised to call immediately if he has any concerning symptoms in the interval. The patient voices understanding of current disease status and treatment options and is in agreement with the current care plan.  All questions were answered. The patient knows to call the clinic with any problems, questions or concerns. We can certainly see the patient much sooner if necessary.  Disclaimer: This note was dictated with voice recognition software. Similar sounding words can inadvertently be transcribed and may not be corrected upon review.

## 2015-03-06 ENCOUNTER — Ambulatory Visit (HOSPITAL_BASED_OUTPATIENT_CLINIC_OR_DEPARTMENT_OTHER): Payer: BLUE CROSS/BLUE SHIELD

## 2015-03-06 ENCOUNTER — Other Ambulatory Visit (HOSPITAL_BASED_OUTPATIENT_CLINIC_OR_DEPARTMENT_OTHER): Payer: BLUE CROSS/BLUE SHIELD

## 2015-03-06 DIAGNOSIS — D751 Secondary polycythemia: Secondary | ICD-10-CM | POA: Diagnosis not present

## 2015-03-06 DIAGNOSIS — N289 Disorder of kidney and ureter, unspecified: Secondary | ICD-10-CM

## 2015-03-06 LAB — CBC WITH DIFFERENTIAL/PLATELET
BASO%: 0.5 % (ref 0.0–2.0)
BASOS ABS: 0 10*3/uL (ref 0.0–0.1)
EOS ABS: 0.1 10*3/uL (ref 0.0–0.5)
EOS%: 1.1 % (ref 0.0–7.0)
HEMATOCRIT: 49 % (ref 38.4–49.9)
HGB: 16.4 g/dL (ref 13.0–17.1)
LYMPH%: 15.7 % (ref 14.0–49.0)
MCH: 29.8 pg (ref 27.2–33.4)
MCHC: 33.5 g/dL (ref 32.0–36.0)
MCV: 88.9 fL (ref 79.3–98.0)
MONO#: 0.6 10*3/uL (ref 0.1–0.9)
MONO%: 8.5 % (ref 0.0–14.0)
NEUT%: 74.2 % (ref 39.0–75.0)
NEUTROS ABS: 4.9 10*3/uL (ref 1.5–6.5)
Platelets: 152 10*3/uL (ref 140–400)
RBC: 5.51 10*6/uL (ref 4.20–5.82)
RDW: 13.8 % (ref 11.0–14.6)
WBC: 6.6 10*3/uL (ref 4.0–10.3)
lymph#: 1 10*3/uL (ref 0.9–3.3)

## 2015-03-06 NOTE — Patient Instructions (Signed)

## 2015-03-21 ENCOUNTER — Encounter: Payer: Self-pay | Admitting: Internal Medicine

## 2015-03-21 ENCOUNTER — Ambulatory Visit (HOSPITAL_BASED_OUTPATIENT_CLINIC_OR_DEPARTMENT_OTHER): Payer: BLUE CROSS/BLUE SHIELD

## 2015-03-21 ENCOUNTER — Other Ambulatory Visit (HOSPITAL_BASED_OUTPATIENT_CLINIC_OR_DEPARTMENT_OTHER): Payer: BLUE CROSS/BLUE SHIELD

## 2015-03-21 ENCOUNTER — Ambulatory Visit (HOSPITAL_BASED_OUTPATIENT_CLINIC_OR_DEPARTMENT_OTHER): Payer: BLUE CROSS/BLUE SHIELD | Admitting: Internal Medicine

## 2015-03-21 ENCOUNTER — Telehealth: Payer: Self-pay | Admitting: Internal Medicine

## 2015-03-21 VITALS — BP 142/85 | HR 102 | Temp 98.2°F | Resp 18 | Ht 67.0 in | Wt 206.6 lb

## 2015-03-21 DIAGNOSIS — D751 Secondary polycythemia: Secondary | ICD-10-CM

## 2015-03-21 DIAGNOSIS — N289 Disorder of kidney and ureter, unspecified: Secondary | ICD-10-CM

## 2015-03-21 LAB — CBC WITH DIFFERENTIAL/PLATELET
BASO%: 0.7 % (ref 0.0–2.0)
Basophils Absolute: 0 10*3/uL (ref 0.0–0.1)
EOS ABS: 0.1 10*3/uL (ref 0.0–0.5)
EOS%: 1.7 % (ref 0.0–7.0)
HEMATOCRIT: 47.2 % (ref 38.4–49.9)
HGB: 15.9 g/dL (ref 13.0–17.1)
LYMPH%: 16.9 % (ref 14.0–49.0)
MCH: 29.7 pg (ref 27.2–33.4)
MCHC: 33.7 g/dL (ref 32.0–36.0)
MCV: 88.2 fL (ref 79.3–98.0)
MONO#: 0.4 10*3/uL (ref 0.1–0.9)
MONO%: 7.2 % (ref 0.0–14.0)
NEUT#: 4 10*3/uL (ref 1.5–6.5)
NEUT%: 73.5 % (ref 39.0–75.0)
PLATELETS: 165 10*3/uL (ref 140–400)
RBC: 5.35 10*6/uL (ref 4.20–5.82)
RDW: 13.3 % (ref 11.0–14.6)
WBC: 5.4 10*3/uL (ref 4.0–10.3)
lymph#: 0.9 10*3/uL (ref 0.9–3.3)

## 2015-03-21 NOTE — Progress Notes (Signed)
Patient tolerated phlebotomy well. Procedure  began 11:53 and ended 11:59. 1 unit obtained. Patient declined nourishment.

## 2015-03-21 NOTE — Progress Notes (Signed)
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  INTERVAL HISTORY: Bradley Riley 59 y.o. male returns to the clinic today for follow-up visit. The patient is feeling fine today was no specific complaints. He tolerated his previous phlebotomy fairly well. He denied having any significant chest pain, shortness of breath, cough or hemoptysis. Has no significant weight loss or night sweats. The patient denied having any fever or chills, no nausea or vomiting. He has no headache or visual changes. He is here for evaluation and discussion of his lab results.  MEDICAL HISTORY: Past Medical History  Diagnosis Date  . Hyperlipidemia   . UTI (lower urinary tract infection)   . Other and unspecified hyperlipidemia 07/29/2007    Centricity Description: DYSLIPIDEMIA Qualifier: Diagnosis of  By: Garen Grams   Centricity Description: HYPERLIPIDEMIA Qualifier: Diagnosis of  By: Garen Grams    . HYPERTHYROIDISM 07/29/2007    Qualifier: Diagnosis of  By: Garen Grams    . HYPERTENSION 07/29/2007    Qualifier: Diagnosis of  By: Garen Grams    . 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 GERD 07/29/2007    Qualifier: Diagnosis of  By: Garen Grams    . History of renal stone 12/29/2013  . Polycythemia 12/29/2013    ALLERGIES:  has No Known Allergies.  MEDICATIONS:  Current Outpatient Prescriptions  Medication Sig Dispense Refill  . amLODipine (NORVASC) 5 MG tablet Take 1 tablet (5 mg total) by mouth daily. 90 tablet 3  . aspirin EC 81 MG tablet Take 1 tablet (81 mg total) by mouth daily. 90 tablet 11  . atorvastatin (LIPITOR) 10 MG tablet Take 1 tablet (10  mg total) by mouth daily. 90 tablet 3  . irbesartan (AVAPRO) 300 MG tablet Take 1 tablet (300 mg total) by mouth daily. 90 tablet 3   No current facility-administered medications for this visit.    SURGICAL HISTORY: History reviewed. No pertinent past surgical history.  REVIEW OF SYSTEMS:  A comprehensive review of systems was negative.   PHYSICAL EXAMINATION: General appearance: alert, cooperative and no distress Head: Normocephalic, without obvious abnormality, atraumatic Neck: no adenopathy, no JVD, supple, symmetrical, trachea midline and thyroid not enlarged, symmetric, no tenderness/mass/nodules Lymph nodes: Cervical, supraclavicular, and axillary nodes normal. Resp: clear to auscultation bilaterally Back: symmetric, no curvature. ROM normal. No CVA tenderness. Cardio: regular rate and rhythm, S1, S2 normal, no murmur, click, rub or gallop GI: soft, non-tender; bowel sounds normal; no masses,  no organomegaly Extremities: extremities normal, atraumatic, no cyanosis or edema  ECOG PERFORMANCE STATUS: 0 - Asymptomatic  Blood pressure 142/85, pulse 102, temperature 98.2 F (36.8 C), temperature source Oral, resp. rate 18, height 5\' 7"  (1.702 m), weight 206 lb 9.6 oz (93.713 kg), SpO2 98 %.  LABORATORY DATA: Lab Results  Component Value Date   WBC 5.4 03/21/2015   HGB 15.9 03/21/2015   HCT 47.2 03/21/2015   MCV 88.2 03/21/2015   PLT 165 03/21/2015      Chemistry      Component Value Date/Time   NA 137 01/29/2015 1056   NA 135 01/02/2015 1351   K 4.6 01/29/2015 1056   K  4.7 01/02/2015 1351   CL 104 01/02/2015 1351   CO2 25 01/29/2015 1056   CO2 24 01/02/2015 1351   BUN 23.9 01/29/2015 1056   BUN 23 01/02/2015 1351   CREATININE 1.7* 01/29/2015 1056   CREATININE 1.64* 01/02/2015 1351      Component Value Date/Time   CALCIUM 8.9 01/29/2015 1056   CALCIUM 9.3 01/02/2015 1351   ALKPHOS 57 01/29/2015 1056   ALKPHOS 59 01/02/2015 1351   AST 17 01/29/2015 1056   AST  18 01/02/2015 1351   ALT 13 01/29/2015 1056   ALT 14 01/02/2015 1351   BILITOT 1.02 01/29/2015 1056   BILITOT 1.1 01/02/2015 1351       RADIOGRAPHIC STUDIES: No results found.  ASSESSMENT AND PLAN: This is a very pleasant 59 years old white male with persistent polycythemia suspicious for polycythemia vera in the absence of JAK 2 mutation as the patient has no other underlying etiology to explain reactive erythrocytosis with family history of leukemia. I recommended for the patient to proceed with phlebotomy to keep his hematocrit around 45%. He will have phlebotomy performed today. The patient expected to be overseas for the next few months to visit his family in [place]. I will see him back for follow-up visit in early August 2016 with repeat blood work and phlebotomy if needed. The patient also has mild renal insufficiency, I recommended for him to get a referral from his primary care physician to see a nephrologist for this problem. He was advised to call immediately if he has any concerning symptoms in the interval. The patient voices understanding of current disease status and treatment options and is in agreement with the current care plan.  All questions were answered. The patient knows to call the clinic with any problems, questions or concerns. We can certainly see the patient much sooner if necessary.  Disclaimer: This note was dictated with voice recognition software. Similar sounding words can inadvertently be transcribed and may not be corrected upon review.

## 2015-03-21 NOTE — Telephone Encounter (Signed)
Gave avs & calendar for August °

## 2015-04-04 ENCOUNTER — Other Ambulatory Visit: Payer: BLUE CROSS/BLUE SHIELD

## 2015-07-12 ENCOUNTER — Other Ambulatory Visit: Payer: BLUE CROSS/BLUE SHIELD

## 2015-07-12 ENCOUNTER — Ambulatory Visit: Payer: BLUE CROSS/BLUE SHIELD | Admitting: Internal Medicine

## 2016-01-07 ENCOUNTER — Other Ambulatory Visit (INDEPENDENT_AMBULATORY_CARE_PROVIDER_SITE_OTHER): Payer: BLUE CROSS/BLUE SHIELD

## 2016-01-07 DIAGNOSIS — Z0000 Encounter for general adult medical examination without abnormal findings: Secondary | ICD-10-CM | POA: Diagnosis not present

## 2016-01-07 LAB — TSH: TSH: 1.35 u[IU]/mL (ref 0.35–4.50)

## 2016-01-07 LAB — URINALYSIS, ROUTINE W REFLEX MICROSCOPIC
BILIRUBIN URINE: NEGATIVE
KETONES UR: NEGATIVE
LEUKOCYTES UA: NEGATIVE
Nitrite: NEGATIVE
PH: 6 (ref 5.0–8.0)
SPECIFIC GRAVITY, URINE: 1.025 (ref 1.000–1.030)
UROBILINOGEN UA: 0.2 (ref 0.0–1.0)
Urine Glucose: NEGATIVE

## 2016-01-07 LAB — BASIC METABOLIC PANEL
BUN: 25 mg/dL — ABNORMAL HIGH (ref 6–23)
CHLORIDE: 104 meq/L (ref 96–112)
CO2: 28 mEq/L (ref 19–32)
Calcium: 9.6 mg/dL (ref 8.4–10.5)
Creatinine, Ser: 1.65 mg/dL — ABNORMAL HIGH (ref 0.40–1.50)
GFR: 45.38 mL/min — ABNORMAL LOW (ref >60.00)
GLUCOSE: 118 mg/dL — AB (ref 70–99)
POTASSIUM: 5.1 meq/L (ref 3.5–5.1)
SODIUM: 140 meq/L (ref 135–145)

## 2016-01-07 LAB — LIPID PANEL
CHOL/HDL RATIO: 6
Cholesterol: 207 mg/dL — ABNORMAL HIGH (ref 0–200)
HDL: 33.4 mg/dL — ABNORMAL LOW (ref >39.00)
LDL CALC: 146 mg/dL — AB (ref 0–99)
NonHDL: 173.25
TRIGLYCERIDES: 134 mg/dL (ref 0.0–149.0)
VLDL: 26.8 mg/dL (ref 0.0–40.0)

## 2016-01-07 LAB — HEPATIC FUNCTION PANEL
ALBUMIN: 4.5 g/dL (ref 3.5–5.2)
ALK PHOS: 70 U/L (ref 39–117)
ALT: 16 U/L (ref 0–53)
AST: 18 U/L (ref 0–37)
BILIRUBIN DIRECT: 0.1 mg/dL (ref 0.0–0.3)
TOTAL PROTEIN: 7.5 g/dL (ref 6.0–8.3)
Total Bilirubin: 0.7 mg/dL (ref 0.2–1.2)

## 2016-01-07 LAB — CBC WITH DIFFERENTIAL/PLATELET
BASOS ABS: 0.1 10*3/uL (ref 0.0–0.1)
Basophils Relative: 1.2 % (ref 0.0–3.0)
Eosinophils Absolute: 0.1 10*3/uL (ref 0.0–0.7)
Eosinophils Relative: 1.4 % (ref 0.0–5.0)
HCT: 53.9 % — ABNORMAL HIGH (ref 39.0–52.0)
Hemoglobin: 17.9 g/dL — ABNORMAL HIGH (ref 13.0–17.0)
LYMPHS ABS: 1.3 10*3/uL (ref 0.7–4.0)
Lymphocytes Relative: 21.4 % (ref 12.0–46.0)
MCHC: 33.3 g/dL (ref 30.0–36.0)
MCV: 84.3 fl (ref 78.0–100.0)
MONO ABS: 0.7 10*3/uL (ref 0.1–1.0)
Monocytes Relative: 10.7 % (ref 3.0–12.0)
NEUTROS ABS: 4.1 10*3/uL (ref 1.4–7.7)
NEUTROS PCT: 65.3 % (ref 43.0–77.0)
PLATELETS: 179 10*3/uL (ref 150.0–400.0)
RBC: 6.39 Mil/uL — AB (ref 4.22–5.81)
RDW: 15.4 % (ref 11.5–15.5)
WBC: 6.3 10*3/uL (ref 4.0–10.5)

## 2016-01-07 LAB — PSA: PSA: 2.89 ng/mL (ref 0.10–4.00)

## 2016-01-08 ENCOUNTER — Ambulatory Visit (INDEPENDENT_AMBULATORY_CARE_PROVIDER_SITE_OTHER): Payer: BLUE CROSS/BLUE SHIELD | Admitting: Internal Medicine

## 2016-01-08 VITALS — BP 140/86 | HR 101 | Temp 98.6°F | Resp 20 | Ht 67.0 in | Wt 209.0 lb

## 2016-01-08 DIAGNOSIS — R739 Hyperglycemia, unspecified: Secondary | ICD-10-CM | POA: Diagnosis not present

## 2016-01-08 DIAGNOSIS — Z0000 Encounter for general adult medical examination without abnormal findings: Secondary | ICD-10-CM | POA: Diagnosis not present

## 2016-01-08 DIAGNOSIS — D751 Secondary polycythemia: Secondary | ICD-10-CM

## 2016-01-08 DIAGNOSIS — R972 Elevated prostate specific antigen [PSA]: Secondary | ICD-10-CM | POA: Diagnosis not present

## 2016-01-08 DIAGNOSIS — E785 Hyperlipidemia, unspecified: Secondary | ICD-10-CM

## 2016-01-08 MED ORDER — IRBESARTAN 300 MG PO TABS: 300.0000 mg | ORAL_TABLET | Freq: Every day | ORAL | Status: DC

## 2016-01-08 MED ORDER — ATORVASTATIN CALCIUM 10 MG PO TABS: 10.0000 mg | ORAL_TABLET | Freq: Every day | ORAL | Status: DC

## 2016-01-08 NOTE — Progress Notes (Signed)
Subjective:    Patient ID: Bradley Riley, male    DOB: [birth year], 60 y.o.   MRN: [id number]  HPI    Here for wellness and f/u;  Overall doing ok;  Pt denies Chest pain, worsening SOB, DOE, wheezing, orthopnea, PND, worsening LE edema, palpitations, dizziness or syncope.  Pt denies neurological change such as new headache, facial or extremity weakness.  Pt denies polydipsia, polyuria, or low sugar symptoms. Pt states overall good compliance with treatment and medications, good tolerability, and has been trying to follow appropriate diet.  Pt denies worsening depressive symptoms, suicidal ideation or panic. No fever, night sweats, wt loss, loss of appetite, or other constitutional symptoms.  Pt states good ability with ADL's, has low fall risk, home safety reviewed and adequate, no other significant changes in hearing or vision, and only occasionally active with exercise. Has been seeing Dr Earlie Server. Missed f/u appt as he was [place] of [place].  Needs referral  Enjoyed last visit as they both speak arabic.Interstingly he donates blood at least twice per yr, and recently has seemed more dark to him  Declines flu shot this yr. Not taking lipitor, trying to work on the diet first, no soft drinks, no fast food.  Wt Readings from Last 3 Encounters:  01/08/16 209 lb (94.802 kg)  03/21/15 206 lb 9.6 oz (93.713 kg)  02/20/15 210 lb 12.8 oz (95.618 kg)   Past Medical History  Diagnosis Date  . Hyperlipidemia   . UTI (lower urinary tract infection)   . Other and unspecified hyperlipidemia 07/29/2007    Centricity Description: DYSLIPIDEMIA Qualifier: Diagnosis of  By: Garen Grams   Centricity Description: HYPERLIPIDEMIA Qualifier: Diagnosis of  By: Garen Grams    . HYPERTHYROIDISM 07/29/2007    Qualifier: Diagnosis of  By: Garen Grams    . HYPERTENSION 07/29/2007    Qualifier: Diagnosis of  By: Garen Grams    . HEPATITIS A, VIRAL, W/O HEPATIC COMA 07/29/2007    Qualifier: Diagnosis of  By:  Garen Grams    . GERD 07/29/2007    Qualifier: Diagnosis of  By: Garen Grams    . History of renal stone 12/29/2013  . Polycythemia 12/29/2013   No past surgical history on file.  reports that he has never smoked. He does not have any smokeless tobacco history on file. He reports that he does not drink alcohol or use illicit drugs. family history includes Hypertension in his father and mother. No Known Allergies Current Outpatient Prescriptions on File Prior to Visit  Medication Sig Dispense Refill  . aspirin EC 81 MG tablet Take 1 tablet (81 mg total) by mouth daily. 90 tablet 11  . atorvastatin (LIPITOR) 10 MG tablet Take 1 tablet (10 mg total) by mouth daily. 90 tablet 3  . irbesartan (AVAPRO) 300 MG tablet Take 1 tablet (300 mg total) by mouth daily. 90 tablet 3   No current facility-administered medications on file prior to visit.   Review of Systems Constitutional: Negative for increased diaphoresis, other activity, appetite or siginficant weight change other than noted HENT: Negative for worsening hearing loss, ear pain, facial swelling, mouth sores and neck stiffness.   Eyes: Negative for other worsening pain, redness or visual disturbance.  Respiratory: Negative for shortness of breath and wheezing  Cardiovascular: Negative for chest pain and palpitations.  Gastrointestinal: Negative for diarrhea, blood in stool, abdominal distention or other pain Genitourinary: Negative for hematuria, flank pain or change in urine volume.  Musculoskeletal: Negative for myalgias or  other joint complaints.  Skin: Negative for color change and wound or drainage.  Neurological: Negative for syncope and numbness. other than noted Hematological: Negative for adenopathy. or other swelling Psychiatric/Behavioral: Negative for hallucinations, SI, self-injury, decreased concentration or other worsening agitation.      Objective:   Physical Exam BP 140/86 mmHg  Pulse 101  Temp(Src) 98.6 F  (37 C) (Oral)  Resp 20  Ht 5\' 7"  (1.702 m)  Wt 209 lb (94.802 kg)  BMI 32.73 kg/m2  SpO2 95% VS noted,  Constitutional: Pt is oriented to person, place, and time. Appears well-developed and well-nourished, in no significant distress Head: Normocephalic and atraumatic.  Right Ear: External ear normal.  Left Ear: External ear normal.  Nose: Nose normal.  Mouth/Throat: Oropharynx is clear and moist.  Eyes: Conjunctivae and EOM are normal. Pupils are equal, round, and reactive to light.  Neck: Normal range of motion. Neck supple. No JVD present. No tracheal deviation present or significant neck LA or mass Cardiovascular: Normal rate, regular rhythm, normal heart sounds and intact distal pulses.   Pulmonary/Chest: Effort normal and breath sounds without rales or wheezing  Abdominal: Soft. Bowel sounds are normal. NT. No HSM  Musculoskeletal: Normal range of motion. Exhibits no edema.  Lymphadenopathy:  Has no cervical adenopathy.  Neurological: Pt is alert and oriented to person, place, and time. Pt has normal reflexes. No cranial nerve deficit. Motor grossly intact Skin: Skin is warm and dry. No rash noted.  Psychiatric:  Has normal mood and affect. Behavior is normal.     Assessment & Plan:

## 2016-01-08 NOTE — Assessment & Plan Note (Signed)
With mild worsening, asympt, for referral back to Dr Earlie Server,  to f/u any worsening symptoms or concerns

## 2016-01-08 NOTE — Patient Instructions (Addendum)
Your EKG was OK today  Please continue all other medications as before, and refills have been done if requested.  Please have the pharmacy call with any other refills you may need.  Please continue your efforts at being more active, low cholesterol diet, and weight control.  You are otherwise up to date with prevention measures today.  Please keep your appointments with your specialists as you may have planned  You will be contacted regarding the referral for: Hematology/Oncology  Please return in 6 months, or sooner if needed, with Lab testing done 3-5 days before

## 2016-01-08 NOTE — Assessment & Plan Note (Signed)
Mild but somewhat suspicious, will re-check at 6 mo

## 2016-01-08 NOTE — Progress Notes (Signed)
Pre visit review using our clinic review tool, if applicable. No additional management support is needed unless otherwise documented below in the visit note. 

## 2016-01-08 NOTE — Assessment & Plan Note (Signed)
Pt now states will take the lipitor, cont low chol diet,  to f/u any worsening symptoms or concerns

## 2016-01-08 NOTE — Assessment & Plan Note (Signed)

## 2016-01-08 NOTE — Addendum Note (Signed)
Addended by: Biagio Borg on: 01/08/2016 02:05 PM   Modules accepted: Orders

## 2016-01-08 NOTE — Assessment & Plan Note (Signed)
Mild, asympt, for f/u a1c with next visit

## 2016-01-16 ENCOUNTER — Other Ambulatory Visit: Payer: Self-pay | Admitting: Medical Oncology

## 2016-01-16 ENCOUNTER — Telehealth: Payer: Self-pay | Admitting: Internal Medicine

## 2016-01-16 NOTE — Telephone Encounter (Signed)
per pof to sch pt appt-cld pt and left message of appt time ^& date for 3/20

## 2016-02-18 ENCOUNTER — Other Ambulatory Visit (HOSPITAL_BASED_OUTPATIENT_CLINIC_OR_DEPARTMENT_OTHER): Payer: BLUE CROSS/BLUE SHIELD

## 2016-02-18 ENCOUNTER — Encounter: Payer: Self-pay | Admitting: Internal Medicine

## 2016-02-18 ENCOUNTER — Telehealth: Payer: Self-pay | Admitting: Internal Medicine

## 2016-02-18 ENCOUNTER — Ambulatory Visit (HOSPITAL_BASED_OUTPATIENT_CLINIC_OR_DEPARTMENT_OTHER): Payer: BLUE CROSS/BLUE SHIELD | Admitting: Internal Medicine

## 2016-02-18 VITALS — BP 158/110 | HR 92 | Temp 98.2°F | Resp 18 | Ht 67.0 in | Wt 212.6 lb

## 2016-02-18 DIAGNOSIS — D751 Secondary polycythemia: Secondary | ICD-10-CM

## 2016-02-18 DIAGNOSIS — N289 Disorder of kidney and ureter, unspecified: Secondary | ICD-10-CM

## 2016-02-18 DIAGNOSIS — Z806 Family history of leukemia: Secondary | ICD-10-CM | POA: Diagnosis not present

## 2016-02-18 NOTE — Progress Notes (Signed)
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  INTERVAL HISTORY: Bradley Riley 60 y.o. male returns to the clinic today for follow-up visit. The patient is feeling fine today with no specific complaints. He was overseas the last few months and he underwent phlebotomy in [place]. He tolerated his previous phlebotomy fairly well. He denied having any significant chest pain, shortness of breath, cough or hemoptysis. Has no significant weight loss or night sweats. The patient denied having any fever or chills, no nausea or vomiting. He has no headache or visual changes. He is here for evaluation and discussion of his lab results.  MEDICAL HISTORY: Past Medical History  Diagnosis Date  . Hyperlipidemia   . UTI (lower urinary tract infection)   . Other and unspecified hyperlipidemia 07/29/2007    Centricity Description: DYSLIPIDEMIA Qualifier: Diagnosis of  By: Garen Grams   Centricity Description: HYPERLIPIDEMIA Qualifier: Diagnosis of  By: Garen Grams    . HYPERTHYROIDISM 07/29/2007    Qualifier: Diagnosis of  By: Garen Grams    . HYPERTENSION 07/29/2007    Qualifier: Diagnosis of  By: Garen Grams    . 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 GERD 07/29/2007    Qualifier: Diagnosis of  By: Garen Grams    . History of renal stone 12/29/2013  . Polycythemia 12/29/2013    ALLERGIES:  has No Known Allergies.  MEDICATIONS:  Current Outpatient Prescriptions  Medication Sig Dispense Refill  . aspirin EC 81 MG tablet Take 1 tablet (81 mg total) by mouth daily. 90 tablet 11  . atorvastatin (LIPITOR) 10 MG tablet Take 1 tablet (10 mg total) by  mouth daily. 90 tablet 3  . irbesartan (AVAPRO) 300 MG tablet Take 1 tablet (300 mg total) by mouth daily. 90 tablet 3   No current facility-administered medications for this visit.    SURGICAL HISTORY: No past surgical history on file.  REVIEW OF SYSTEMS:  A comprehensive review of systems was negative.   PHYSICAL EXAMINATION: General appearance: alert, cooperative and no distress Head: Normocephalic, without obvious abnormality, atraumatic Neck: no adenopathy, no JVD, supple, symmetrical, trachea midline and thyroid not enlarged, symmetric, no tenderness/mass/nodules Lymph nodes: Cervical, supraclavicular, and axillary nodes normal. Resp: clear to auscultation bilaterally Back: symmetric, no curvature. ROM normal. No CVA tenderness. Cardio: regular rate and rhythm, S1, S2 normal, no murmur, click, rub or gallop GI: soft, non-tender; bowel sounds normal; no masses,  no organomegaly Extremities: extremities normal, atraumatic, no cyanosis or edema  ECOG PERFORMANCE STATUS: 0 - Asymptomatic  Blood pressure 158/110, pulse 92, temperature 98.2 F (36.8 C), temperature source Oral, resp. rate 18, height 5\' 7"  (1.702 m), weight 212 lb 9.6 oz (96.435 kg), SpO2 98 %.  LABORATORY DATA: Lab Results  Component Value Date   WBC 6.3 01/07/2016   HGB 17.9* 01/07/2016   HCT 53.9* 01/07/2016   MCV 84.3 01/07/2016   PLT 179.0 01/07/2016      Chemistry      Component Value Date/Time   NA 140 01/07/2016 1105   NA 137 01/29/2015 1056   K 5.1 01/07/2016 1105   K 4.6 01/29/2015 1056   CL 104  01/07/2016 1105   CO2 28 01/07/2016 1105   CO2 25 01/29/2015 1056   BUN 25* 01/07/2016 1105   BUN 23.9 01/29/2015 1056   CREATININE 1.65* 01/07/2016 1105   CREATININE 1.7* 01/29/2015 1056      Component Value Date/Time   CALCIUM 9.6 01/07/2016 1105   CALCIUM 8.9 01/29/2015 1056   ALKPHOS 70 01/07/2016 1105   ALKPHOS 57 01/29/2015 1056   AST 18 01/07/2016 1105   AST 17 01/29/2015 1056   ALT 16  01/07/2016 1105   ALT 13 01/29/2015 1056   BILITOT 0.7 01/07/2016 1105   BILITOT 1.02 01/29/2015 1056       RADIOGRAPHIC STUDIES: No results found.  ASSESSMENT AND PLAN: This is a very pleasant 60 years old white male with persistent polycythemia suspicious for polycythemia vera in the absence of JAK 2 mutation as the patient has no other underlying etiology to explain reactive erythrocytosis with family history of leukemia. His hematocrit today is 53.9%. I recommended for the patient to proceed with phlebotomy to keep his hematocrit around 45%. He would like to do the phlebotomy at the [facility]. I will see him back for follow-up visit in 3 months for reevaluation with repeat CBC. He was advised to call immediately if he has any concerning symptoms in the interval. The patient voices understanding of current disease status and treatment options and is in agreement with the current care plan.  All questions were answered. The patient knows to call the clinic with any problems, questions or concerns. We can certainly see the patient much sooner if necessary.  Disclaimer: This note was dictated with voice recognition software. Similar sounding words can inadvertently be transcribed and may not be corrected upon review.

## 2016-02-18 NOTE — Telephone Encounter (Signed)
per pf to sch pt appt-gave pt copy of avs °

## 2016-06-23 ENCOUNTER — Ambulatory Visit: Payer: BLUE CROSS/BLUE SHIELD | Admitting: Internal Medicine

## 2016-06-23 ENCOUNTER — Other Ambulatory Visit: Payer: BLUE CROSS/BLUE SHIELD

## 2016-07-01 ENCOUNTER — Ambulatory Visit: Payer: BLUE CROSS/BLUE SHIELD | Admitting: Internal Medicine

## 2017-01-13 ENCOUNTER — Telehealth: Payer: Self-pay | Admitting: Internal Medicine

## 2017-01-13 DIAGNOSIS — R739 Hyperglycemia, unspecified: Secondary | ICD-10-CM

## 2017-01-13 DIAGNOSIS — Z0001 Encounter for general adult medical examination with abnormal findings: Secondary | ICD-10-CM

## 2017-01-13 NOTE — Telephone Encounter (Signed)
Patient has a CPE on March 1 at 130pm. He would like to do his labs before the appointment. If that is ok can you please let him that his labs are in. Or advise me. Thank you.

## 2017-01-17 NOTE — Telephone Encounter (Signed)
OK for labs, they are now available to do anytime prior to his visit

## 2017-01-17 NOTE — Addendum Note (Signed)
Addended by: Biagio Borg on: 01/17/2017 11:41 PM   Modules accepted: Orders

## 2017-01-19 NOTE — Telephone Encounter (Signed)
Called pt no answer LMOM w/MD response../lmb 

## 2017-01-22 ENCOUNTER — Other Ambulatory Visit (INDEPENDENT_AMBULATORY_CARE_PROVIDER_SITE_OTHER): Payer: BLUE CROSS/BLUE SHIELD

## 2017-01-22 ENCOUNTER — Telehealth: Payer: Self-pay

## 2017-01-22 DIAGNOSIS — Z0001 Encounter for general adult medical examination with abnormal findings: Secondary | ICD-10-CM | POA: Diagnosis not present

## 2017-01-22 DIAGNOSIS — R739 Hyperglycemia, unspecified: Secondary | ICD-10-CM

## 2017-01-22 LAB — CBC WITH DIFFERENTIAL/PLATELET
Basophils Absolute: 0.1 10*3/uL (ref 0.0–0.1)
Basophils Relative: 0.8 % (ref 0.0–3.0)
EOS PCT: 1.2 % (ref 0.0–5.0)
Eosinophils Absolute: 0.1 10*3/uL (ref 0.0–0.7)
HCT: 55.7 % — ABNORMAL HIGH (ref 39.0–52.0)
Hemoglobin: 19 g/dL (ref 13.0–17.0)
LYMPHS ABS: 1.2 10*3/uL (ref 0.7–4.0)
Lymphocytes Relative: 17.8 % (ref 12.0–46.0)
MCHC: 34.1 g/dL (ref 30.0–36.0)
MCV: 88.6 fl (ref 78.0–100.0)
MONOS PCT: 10.7 % (ref 3.0–12.0)
Monocytes Absolute: 0.7 10*3/uL (ref 0.1–1.0)
NEUTROS ABS: 4.6 10*3/uL (ref 1.4–7.7)
NEUTROS PCT: 69.5 % (ref 43.0–77.0)
PLATELETS: 161 10*3/uL (ref 150.0–400.0)
RBC: 6.29 Mil/uL — ABNORMAL HIGH (ref 4.22–5.81)
RDW: 13.4 % (ref 11.5–15.5)
WBC: 6.6 10*3/uL (ref 4.0–10.5)

## 2017-01-22 LAB — URINALYSIS, ROUTINE W REFLEX MICROSCOPIC
Bilirubin Urine: NEGATIVE
Ketones, ur: NEGATIVE
Leukocytes, UA: NEGATIVE
Nitrite: NEGATIVE
PH: 6.5 (ref 5.0–8.0)
SPECIFIC GRAVITY, URINE: 1.02 (ref 1.000–1.030)
Total Protein, Urine: 100 — AB
Urine Glucose: NEGATIVE
Urobilinogen, UA: 0.2 (ref 0.0–1.0)
WBC, UA: NONE SEEN (ref 0–?)

## 2017-01-22 LAB — BASIC METABOLIC PANEL
BUN: 20 mg/dL (ref 6–23)
CALCIUM: 9.4 mg/dL (ref 8.4–10.5)
CO2: 30 mEq/L (ref 19–32)
Chloride: 101 mEq/L (ref 96–112)
Creatinine, Ser: 1.56 mg/dL — ABNORMAL HIGH (ref 0.40–1.50)
GFR: 48.25 mL/min — AB (ref >60.00)
Glucose, Bld: 98 mg/dL (ref 70–99)
POTASSIUM: 4.4 meq/L (ref 3.5–5.1)
SODIUM: 138 meq/L (ref 135–145)

## 2017-01-22 LAB — HEMOGLOBIN A1C: HEMOGLOBIN A1C: 5.7 % (ref 4.6–6.5)

## 2017-01-22 LAB — PSA: PSA: 1.52 ng/mL (ref 0.10–4.00)

## 2017-01-22 LAB — HEPATIC FUNCTION PANEL
ALK PHOS: 69 U/L (ref 39–117)
ALT: 13 U/L (ref 0–53)
AST: 16 U/L (ref 0–37)
Albumin: 4.5 g/dL (ref 3.5–5.2)
BILIRUBIN DIRECT: 0.1 mg/dL (ref 0.0–0.3)
TOTAL PROTEIN: 7.2 g/dL (ref 6.0–8.3)
Total Bilirubin: 0.9 mg/dL (ref 0.2–1.2)

## 2017-01-22 LAB — LIPID PANEL
Cholesterol: 222 mg/dL — ABNORMAL HIGH (ref 0–200)
HDL: 35.9 mg/dL — ABNORMAL LOW (ref >39.00)
LDL Cholesterol: 160 mg/dL — ABNORMAL HIGH (ref 0–99)
NONHDL: 186.3
Total CHOL/HDL Ratio: 6
Triglycerides: 134 mg/dL (ref 0.0–149.0)
VLDL: 26.8 mg/dL (ref 0.0–40.0)

## 2017-01-22 LAB — TSH: TSH: 1.45 u[IU]/mL (ref 0.35–4.50)

## 2017-01-22 NOTE — Telephone Encounter (Signed)
CRITICAL VALUE STICKER  CRITICAL VALUE: Hemoglobin is 19.0 Hct 55.7 (close to critical)  DATE & TIME NOTIFIED: 01/22/2017 at 11:40 am  MESSENGER (representative from lab): Santiago Glad from lab called.   MD NOTIFIED: Yes  TIME OF NOTIFICATION: 11:41am  RESPONSE: forwarded to pcp after giving PCP verbal critical value.

## 2017-01-22 NOTE — Telephone Encounter (Signed)
This is noted problem followed in the past per Dr Earlie Server, last seen mar 2017, will f/u with pt at next visit, thanks

## 2017-01-29 ENCOUNTER — Ambulatory Visit (INDEPENDENT_AMBULATORY_CARE_PROVIDER_SITE_OTHER): Payer: BLUE CROSS/BLUE SHIELD | Admitting: Internal Medicine

## 2017-01-29 ENCOUNTER — Encounter: Payer: Self-pay | Admitting: Internal Medicine

## 2017-01-29 VITALS — BP 140/92 | HR 102 | Temp 97.6°F | Ht 67.0 in | Wt 208.0 lb

## 2017-01-29 DIAGNOSIS — D45 Polycythemia vera: Secondary | ICD-10-CM

## 2017-01-29 DIAGNOSIS — M5136 Other intervertebral disc degeneration, lumbar region: Secondary | ICD-10-CM | POA: Insufficient documentation

## 2017-01-29 DIAGNOSIS — Z0001 Encounter for general adult medical examination with abnormal findings: Secondary | ICD-10-CM

## 2017-01-29 DIAGNOSIS — N183 Chronic kidney disease, stage 3 unspecified: Secondary | ICD-10-CM

## 2017-01-29 DIAGNOSIS — M5126 Other intervertebral disc displacement, lumbar region: Secondary | ICD-10-CM

## 2017-01-29 DIAGNOSIS — Z1159 Encounter for screening for other viral diseases: Secondary | ICD-10-CM | POA: Diagnosis not present

## 2017-01-29 DIAGNOSIS — M51369 Other intervertebral disc degeneration, lumbar region without mention of lumbar back pain or lower extremity pain: Secondary | ICD-10-CM | POA: Insufficient documentation

## 2017-01-29 DIAGNOSIS — R739 Hyperglycemia, unspecified: Secondary | ICD-10-CM

## 2017-01-29 DIAGNOSIS — I10 Essential (primary) hypertension: Secondary | ICD-10-CM | POA: Diagnosis not present

## 2017-01-29 DIAGNOSIS — E785 Hyperlipidemia, unspecified: Secondary | ICD-10-CM | POA: Diagnosis not present

## 2017-01-29 MED ORDER — LOSARTAN POTASSIUM 50 MG PO TABS: 50.0000 mg | ORAL_TABLET | Freq: Every day | ORAL | 3 refills | Status: DC

## 2017-01-29 MED ORDER — ATORVASTATIN CALCIUM 10 MG PO TABS: 10.0000 mg | ORAL_TABLET | Freq: Every day | ORAL | 3 refills | Status: DC

## 2017-01-29 NOTE — Progress Notes (Signed)
Subjective:    Patient ID: Bradley Riley, male    DOB: 1956-02-25, 61 y.o.   MRN: [id number]  HPI  Here for wellness and f/u;  Overall doing ok;  Pt denies Chest pain, worsening SOB, DOE, wheezing, orthopnea, PND, worsening LE edema, palpitations, dizziness or syncope.  Pt denies neurological change such as new headache, facial or extremity weakness.  Pt denies polydipsia, polyuria, or low sugar symptoms. Pt states overall good compliance with treatment and medications, good tolerability, and has been trying to follow appropriate diet.  Pt denies worsening depressive symptoms, suicidal ideation or panic. No fever, night sweats, wt loss, loss of appetite, or other constitutional symptoms.  Pt states good ability with ADL's, has low fall risk, home safety reviewed and adequate, no other significant changes in hearing or vision, and only occasionally active with exercise.  Wt Readings from Last 3 Encounters:  01/29/17 208 lb (94.3 kg)  02/18/16 212 lb 9.6 oz (96.4 kg)  01/08/16 209 lb (94.8 kg)  Pt adamant  BP at home < 140/90 and watching diet closely.  Stopped BP med x 6 mo. Also not taking lipitor as thought he could try diet. BP Readings from Last 3 Encounters:  01/29/17 (!) 140/92  02/18/16 (!) 158/110  01/08/16 140/86  Declines flu shot. [place] for [place] to visit family Mar 10.  Pt continues to have recurring LBP and dx with prior herniated disc per Dr Ellene Route; worse in the AM especially to first get up in the AM,, no bowel or bladder change, fever, wt loss,  worsening LE pain/numbness/weakness, gait change or falls.  Has been to PT recenty, but pain persists. Recalls an MRI about 20 yrs ago, has seen chiropracter  Now recalls was rec'd by Dr Ellene Route for [place] but pt deferred., no w pain worse, asks for MRI prior to leaving the country.    Non smoker  Pt recalls prior visit with Dr Mohammed/hematology tx with phlebotomy 1 pint weekly for 4 wks and felt improved.  Past Medical History:  Diagnosis  Date  . GERD 07/29/2007   Qualifier: Diagnosis of  By: Garen Grams    . HEPATITIS A, VIRAL, W/O HEPATIC COMA 07/29/2007   Qualifier: Diagnosis of  By: Garen Grams    . History of renal stone 12/29/2013  . Hyperlipidemia   . HYPERTENSION 07/29/2007   Qualifier: Diagnosis of  By: Garen Grams    . HYPERTHYROIDISM 07/29/2007   Qualifier: Diagnosis of  By: Garen Grams    . Other and unspecified hyperlipidemia 07/29/2007   Centricity Description: DYSLIPIDEMIA Qualifier: Diagnosis of  By: Garen Grams   Centricity Description: HYPERLIPIDEMIA Qualifier: Diagnosis of  By: Garen Grams    . Polycythemia 12/29/2013  . UTI (lower urinary tract infection)    History reviewed. No pertinent surgical history.  reports that he has never smoked. He uses smokeless tobacco. He reports that he does not drink alcohol or use drugs. family history includes Hypertension in his father and mother. No Known Allergies Current Outpatient Prescriptions on File Prior to Visit  Medication Sig Dispense Refill  . aspirin EC 81 MG tablet Take 1 tablet (81 mg total) by mouth daily. 90 tablet 11   No current facility-administered medications on file prior to visit.    Review of Systems Constitutional: Negative for increased diaphoresis, or other activity, appetite or siginficant weight change other than noted HENT: Negative for worsening hearing loss, ear pain, facial swelling, mouth sores and neck stiffness.   Eyes: Negative  for other worsening pain, redness or visual disturbance.  Respiratory: Negative for choking or stridor Cardiovascular: Negative for other chest pain and palpitations.  Gastrointestinal: Negative for worsening diarrhea, blood in stool, or abdominal distention Genitourinary: Negative for hematuria, flank pain or change in urine volume.  Musculoskeletal: Negative for myalgias or other joint complaints.  Skin: Negative for other color change and wound or drainage.  Neurological:  Negative for syncope and numbness. other than noted Hematological: Negative for adenopathy. or other swelling Psychiatric/Behavioral: Negative for hallucinations, SI, self-injury, decreased concentration or other worsening agitation.  All other system neg per pt    Objective:   Physical Exam BP (!) 140/92   Pulse (!) 102   Temp 97.6 F (36.4 C)   Ht 5\' 7"  (1.702 m)   Wt 208 lb (94.3 kg)   SpO2 98%   BMI 32.58 kg/m  VS noted, obese Constitutional: Pt is oriented to person, place, and time. Appears well-developed and well-nourished, in no significant distress Head: Normocephalic and atraumatic  Eyes: Conjunctivae and EOM are normal. Pupils are equal, round, and reactive to light Right Ear: External ear normal.  Left Ear: External ear normal Nose: Nose normal.  Mouth/Throat: Oropharynx is clear and moist  Neck: Normal range of motion. Neck supple. No JVD present. No tracheal deviation present or significant neck LA or mass Cardiovascular: Normal rate, regular rhythm, normal heart sounds and intact distal pulses.   Pulmonary/Chest: Effort normal and breath sounds without rales or wheezing  Abdominal: Soft. Bowel sounds are normal. NT. No HSM  Musculoskeletal: Normal range of motion. Exhibits no edema Lymphadenopathy: Has no cervical adenopathy.  Neurological: Pt is alert and oriented to person, place, and time. Pt has normal reflexes. No cranial nerve deficit. Motor grossly intact Skin: Skin is warm and dry. No rash noted or new ulcers Psychiatric:  Has normal mood and affect. Behavior is normal.  Spine  - non tender No other new exam findigns  ECG today I have personally interpreted Sinus  Rhythm     Assessment & Plan:

## 2017-01-29 NOTE — Patient Instructions (Signed)
Please take all new medication as prescribed - the losartan 50 mg per day  Please continue all other medications as before, and refills have been done if requested., including restarting the lipitor  Please have the pharmacy call with any other refills you may need.  Please continue your efforts at being more active, low cholesterol diet, and weight control.  You are otherwise up to date with prevention measures today.  You will be contacted regarding the referral for: MRI and Dr Alvan Dame, as well as Dr Julien Nordmann  Please keep your appointments with your specialists as you may have planned

## 2017-02-01 NOTE — Assessment & Plan Note (Signed)
With persistent ? Worsening pain, for MRI and Dr Arlington Calix f/u per pt request

## 2017-02-01 NOTE — Assessment & Plan Note (Signed)

## 2017-02-01 NOTE — Assessment & Plan Note (Addendum)
With worsening hgb, has not f/u with heme, did respond to phlebotomy last year, will refer back to Dr Earlie Server  In addition to the time spent performing CPE, I spent an additional 25 minutes face to face,in which greater than 50% of this time was spent in counseling and coordination of care for patient's acute illness as documented.

## 2017-02-01 NOTE — Assessment & Plan Note (Addendum)
BP Readings from Last 3 Encounters:  01/29/17 (!) 140/92  02/18/16 (!) 158/110  01/08/16 140/86  mild uncontrolled, o/w stable overall by history and exam, recent data reviewed with pt, and pt to add losartan 50,  to f/u any worsening symptoms or concern  In addition to the time spent performing CPE, I spent an additional 25 minutes face to face,in which greater than 50% of this time was spent in counseling and coordination of care for patient's acute illness as documented.

## 2017-02-01 NOTE — Assessment & Plan Note (Signed)
For f/u lipids, goal ldl < 100 

## 2017-02-01 NOTE — Assessment & Plan Note (Signed)
Lab Results  Component Value Date   HGBA1C 5.7 01/22/2017   stable

## 2017-02-01 NOTE — Assessment & Plan Note (Signed)
stable overall by history and exam, recent data reviewed with pt, and pt to continue medical treatment as before,  to f/u any worsening symptoms or concerns Lab Results  Component Value Date   CREATININE 1.56 (H) 01/22/2017

## 2017-02-02 ENCOUNTER — Telehealth: Payer: Self-pay | Admitting: Internal Medicine

## 2017-02-02 ENCOUNTER — Other Ambulatory Visit: Payer: Self-pay | Admitting: Medical Oncology

## 2017-02-02 DIAGNOSIS — D45 Polycythemia vera: Secondary | ICD-10-CM

## 2017-02-02 NOTE — Telephone Encounter (Signed)
Called patient and there was no answer. Left message about appt scheduled on 02/05/2017 to see Dr. Julien Nordmann at 10:00 am

## 2017-02-02 NOTE — Progress Notes (Signed)
Schedule request sent for Thursday appts

## 2017-02-05 ENCOUNTER — Encounter: Payer: Self-pay | Admitting: Internal Medicine

## 2017-02-05 ENCOUNTER — Telehealth: Payer: Self-pay | Admitting: Internal Medicine

## 2017-02-05 ENCOUNTER — Other Ambulatory Visit (HOSPITAL_BASED_OUTPATIENT_CLINIC_OR_DEPARTMENT_OTHER): Payer: BLUE CROSS/BLUE SHIELD

## 2017-02-05 ENCOUNTER — Ambulatory Visit: Payer: BLUE CROSS/BLUE SHIELD

## 2017-02-05 ENCOUNTER — Ambulatory Visit (HOSPITAL_BASED_OUTPATIENT_CLINIC_OR_DEPARTMENT_OTHER): Payer: BLUE CROSS/BLUE SHIELD | Admitting: Internal Medicine

## 2017-02-05 VITALS — BP 167/118 | HR 88 | Temp 98.3°F | Resp 16 | Wt 208.5 lb

## 2017-02-05 DIAGNOSIS — D45 Polycythemia vera: Secondary | ICD-10-CM | POA: Diagnosis not present

## 2017-02-05 DIAGNOSIS — I10 Essential (primary) hypertension: Secondary | ICD-10-CM | POA: Diagnosis not present

## 2017-02-05 LAB — COMPREHENSIVE METABOLIC PANEL
ALT: 17 U/L (ref 0–55)
AST: 21 U/L (ref 5–34)
Albumin: 4.1 g/dL (ref 3.5–5.0)
Alkaline Phosphatase: 72 U/L (ref 40–150)
Anion Gap: 7 mEq/L (ref 3–11)
BILIRUBIN TOTAL: 0.91 mg/dL (ref 0.20–1.20)
BUN: 27.9 mg/dL — ABNORMAL HIGH (ref 7.0–26.0)
CHLORIDE: 105 meq/L (ref 98–109)
CO2: 27 meq/L (ref 22–29)
Calcium: 9.2 mg/dL (ref 8.4–10.4)
Creatinine: 1.8 mg/dL — ABNORMAL HIGH (ref 0.7–1.3)
EGFR: 40 mL/min/{1.73_m2} — AB (ref >90)
Glucose: 109 mg/dl (ref 70–140)
Potassium: 4.8 mEq/L (ref 3.5–5.1)
SODIUM: 139 meq/L (ref 136–145)
TOTAL PROTEIN: 7.5 g/dL (ref 6.4–8.3)

## 2017-02-05 LAB — CBC WITH DIFFERENTIAL/PLATELET
BASO%: 0.4 % (ref 0.0–2.0)
Basophils Absolute: 0 10*3/uL (ref 0.0–0.1)
EOS ABS: 0.1 10*3/uL (ref 0.0–0.5)
EOS%: 1.3 % (ref 0.0–7.0)
HCT: 54.5 % — ABNORMAL HIGH (ref 38.4–49.9)
HGB: 18.3 g/dL — ABNORMAL HIGH (ref 13.0–17.1)
LYMPH%: 18.9 % (ref 14.0–49.0)
MCH: 30.1 pg (ref 27.2–33.4)
MCHC: 33.6 g/dL (ref 32.0–36.0)
MCV: 89.6 fL (ref 79.3–98.0)
MONO#: 0.8 10*3/uL (ref 0.1–0.9)
MONO%: 10.8 % (ref 0.0–14.0)
NEUT%: 68.6 % (ref 39.0–75.0)
NEUTROS ABS: 4.8 10*3/uL (ref 1.5–6.5)
Platelets: 120 10*3/uL — ABNORMAL LOW (ref 140–400)
RBC: 6.08 10*6/uL — AB (ref 4.20–5.82)
RDW: 13.6 % (ref 11.0–14.6)
WBC: 6.9 10*3/uL (ref 4.0–10.3)
lymph#: 1.3 10*3/uL (ref 0.9–3.3)

## 2017-02-05 MED ORDER — CLONIDINE HCL 0.1 MG PO TABS
ORAL_TABLET | ORAL | Status: AC
  Filled 2017-02-05: qty 2

## 2017-02-05 MED ORDER — CLONIDINE HCL 0.1 MG PO TABS
0.2000 mg | ORAL_TABLET | Freq: Once | ORAL | Status: AC
Start: 2017-02-05 — End: 2017-02-05
  Administered 2017-02-05: 0.2 mg via ORAL

## 2017-02-05 NOTE — Patient Instructions (Signed)

## 2017-02-05 NOTE — Progress Notes (Signed)
Therapeutic phlebotomy performed successfully using the L medial antecubital site. Rodney Langton, RN obtained blood flow at the site and it was maintained by myself. The procedure began at 1138 and completed at 1146. Patient states no complaints and is having a snack upon completion. Stable at time of discharge.

## 2017-02-05 NOTE — Telephone Encounter (Signed)
Gave patient AVS and schedule per 02/05/2017 los.

## 2017-02-05 NOTE — Progress Notes (Signed)
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  INTERVAL HISTORY: Bradley Riley 61 y.o. male came to the clinic today for follow-up visit. The patient was last seen here ago. He missed several of his appointment. He was seen recently by his primary care physician Dr. Jenny Reichmann and requested reevaluation because of his polycythemia. He denied having any complaints today but his blood pressure is uncontrolled. He was started recently on new blood pressure medication with Cozaar and also on Lipitor for dyslipidemia. He denied having any fever or chills. He has no chest pain, shortness of breath, cough or hemoptysis. He is here today for reevaluation. He had repeat CBC and comprehensive metabolic panel performed earlier today.  MEDICAL HISTORY: Past Medical History:  Diagnosis Date  . GERD 07/29/2007   Qualifier: Diagnosis of  By: Garen Grams    . HEPATITIS A, VIRAL, W/O HEPATIC COMA 07/29/2007   Qualifier: Diagnosis of  By: Garen Grams    . History of renal stone 12/29/2013  . Hyperlipidemia   . HYPERTENSION 07/29/2007   Qualifier: Diagnosis of  By: Garen Grams    . HYPERTHYROIDISM 07/29/2007   Qualifier: Diagnosis of  By: Garen Grams    . Other and unspecified hyperlipidemia 07/29/2007   Centricity Description: DYSLIPIDEMIA Qualifier: Diagnosis of  By: Garen Grams   Centricity Description: HYPERLIPIDEMIA Qualifier: Diagnosis of  By: Garen Grams    . Polycythemia 12/29/2013  . UTI (lower urinary tract infection)     ALLERGIES:  has No Known Allergies.  MEDICATIONS:  Current Outpatient Prescriptions  Medication Sig Dispense Refill  . aspirin EC 81 MG tablet Take 1 tablet (81 mg total) by mouth  daily. 90 tablet 11  . atorvastatin (LIPITOR) 10 MG tablet Take 1 tablet (10 mg total) by mouth daily. 90 tablet 3  . losartan (COZAAR) 50 MG tablet Take 1 tablet (50 mg total) by mouth daily. 90 tablet 3   No current facility-administered medications for this visit.     SURGICAL HISTORY: History reviewed. No pertinent surgical history.  REVIEW OF SYSTEMS:  A comprehensive review of systems was negative.   PHYSICAL EXAMINATION: General appearance: alert, cooperative and no distress Head: Normocephalic, without obvious abnormality, atraumatic Neck: no adenopathy, no JVD, supple, symmetrical, trachea midline and thyroid not enlarged, symmetric, no tenderness/mass/nodules Lymph nodes: Cervical, supraclavicular, and axillary nodes normal. Resp: clear to auscultation bilaterally Back: symmetric, no curvature. ROM normal. No CVA tenderness. Cardio: regular rate and rhythm, S1, S2 normal, no murmur, click, rub or gallop GI: soft, non-tender; bowel sounds normal; no masses,  no organomegaly Extremities: extremities normal, atraumatic, no cyanosis or edema  ECOG PERFORMANCE STATUS: 0 - Asymptomatic  Blood pressure (!) 167/118, pulse 88, temperature 98.3 F (36.8 C), temperature source Oral, resp. rate 16, weight 208 lb 8 oz (94.6 kg), SpO2 95 %.  LABORATORY DATA: Lab Results  Component Value Date   WBC 6.9 02/05/2017   HGB 18.3 (H) 02/05/2017   HCT 54.5 (H) 02/05/2017   MCV 89.6 02/05/2017   PLT 120 (L) 02/05/2017      Chemistry      Component Value Date/Time   NA 139 02/05/2017 0951   K 4.8 02/05/2017 0951   CL 101 01/22/2017 1056  CO2 27 02/05/2017 0951   BUN 27.9 (H) 02/05/2017 0951   CREATININE 1.8 (H) 02/05/2017 0951      Component Value Date/Time   CALCIUM 9.2 02/05/2017 0951   ALKPHOS 72 02/05/2017 0951   AST 21 02/05/2017 0951   ALT 17 02/05/2017 0951   BILITOT 0.91 02/05/2017 0951       RADIOGRAPHIC STUDIES: No results found.  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 His hematocrit is elevated today at 54.5%. I recommended for the patient to proceed with phlebotomy today. He is traveling overseas to [place] and he will stay there for several months. I recommended for the patient to do phlebotomy every 2-3 months while he is there. I will see him back for follow-up visit end of July after he comes back from his trip. For the hypertension, strongly encouraged the patient to take his blood pressure medication as prescribed by Dr. Romie Minus and I will also give him a dose of clonidine 0.2 mg by mouth 1 today. He was advised to call immediately if he has any concerning symptoms in the interval. The patient voices understanding of current disease status and treatment options and is in agreement with the current care plan.  All questions were answered. The patient knows to call the clinic with any problems, questions or concerns. We can certainly see the patient much sooner if necessary. I spent 10 minutes counseling the patient face to face. The total time spent in the appointment was 15 minutes.  Disclaimer: This note was dictated with voice recognition software. Similar sounding words can inadvertently be transcribed and may not be corrected upon review.

## 2017-02-06 ENCOUNTER — Ambulatory Visit
Admission: RE | Admit: 2017-02-06 | Discharge: 2017-02-06 | Disposition: A | Discharge status: Home / Self Care | Payer: BLUE CROSS/BLUE SHIELD | Source: Ambulatory Visit | Attending: Internal Medicine | Admitting: Internal Medicine

## 2017-02-06 DIAGNOSIS — M5126 Other intervertebral disc displacement, lumbar region: Secondary | ICD-10-CM

## 2017-06-20 ENCOUNTER — Telehealth: Payer: Self-pay

## 2017-06-20 NOTE — Telephone Encounter (Signed)
Called and left a message with a new appt as dr Julien Nordmann out of office 7/31

## 2017-06-30 ENCOUNTER — Ambulatory Visit: Payer: BLUE CROSS/BLUE SHIELD | Admitting: Internal Medicine

## 2017-06-30 ENCOUNTER — Other Ambulatory Visit: Payer: BLUE CROSS/BLUE SHIELD

## 2017-07-08 ENCOUNTER — Ambulatory Visit (INDEPENDENT_AMBULATORY_CARE_PROVIDER_SITE_OTHER): Payer: BLUE CROSS/BLUE SHIELD | Admitting: Internal Medicine

## 2017-07-08 ENCOUNTER — Encounter: Payer: Self-pay | Admitting: Internal Medicine

## 2017-07-08 ENCOUNTER — Other Ambulatory Visit (INDEPENDENT_AMBULATORY_CARE_PROVIDER_SITE_OTHER): Payer: BLUE CROSS/BLUE SHIELD

## 2017-07-08 VITALS — BP 146/90 | HR 92 | Ht 67.0 in | Wt 206.0 lb

## 2017-07-08 DIAGNOSIS — N183 Chronic kidney disease, stage 3 unspecified: Secondary | ICD-10-CM

## 2017-07-08 DIAGNOSIS — I10 Essential (primary) hypertension: Secondary | ICD-10-CM

## 2017-07-08 DIAGNOSIS — M5416 Radiculopathy, lumbar region: Secondary | ICD-10-CM | POA: Insufficient documentation

## 2017-07-08 DIAGNOSIS — E785 Hyperlipidemia, unspecified: Secondary | ICD-10-CM | POA: Diagnosis not present

## 2017-07-08 DIAGNOSIS — R739 Hyperglycemia, unspecified: Secondary | ICD-10-CM | POA: Diagnosis not present

## 2017-07-08 DIAGNOSIS — Z0001 Encounter for general adult medical examination with abnormal findings: Secondary | ICD-10-CM

## 2017-07-08 LAB — CBC WITH DIFFERENTIAL/PLATELET
Basophils Absolute: 0.1 10*3/uL (ref 0.0–0.1)
Basophils Relative: 1.2 % (ref 0.0–3.0)
EOS PCT: 1.7 % (ref 0.0–5.0)
Eosinophils Absolute: 0.1 10*3/uL (ref 0.0–0.7)
HCT: 55.2 % — ABNORMAL HIGH (ref 39.0–52.0)
Hemoglobin: 18.5 g/dL (ref 13.0–17.0)
Lymphocytes Relative: 17.3 % (ref 12.0–46.0)
Lymphs Abs: 1.3 10*3/uL (ref 0.7–4.0)
MCHC: 33.4 g/dL (ref 30.0–36.0)
MCV: 91.1 fl (ref 78.0–100.0)
MONO ABS: 0.9 10*3/uL (ref 0.1–1.0)
MONOS PCT: 11.6 % (ref 3.0–12.0)
Neutro Abs: 5.3 10*3/uL (ref 1.4–7.7)
Neutrophils Relative %: 68.2 % (ref 43.0–77.0)
Platelets: 181 10*3/uL (ref 150.0–400.0)
RBC: 6.06 Mil/uL — ABNORMAL HIGH (ref 4.22–5.81)
RDW: 13.7 % (ref 11.5–15.5)
WBC: 7.7 10*3/uL (ref 4.0–10.5)

## 2017-07-08 MED ORDER — ATORVASTATIN CALCIUM 10 MG PO TABS: 10.0000 mg | ORAL_TABLET | Freq: Every day | ORAL | 3 refills | Status: DC

## 2017-07-08 MED ORDER — LOSARTAN POTASSIUM 50 MG PO TABS: 50.0000 mg | ORAL_TABLET | Freq: Every day | ORAL | 3 refills | Status: DC

## 2017-07-08 NOTE — Assessment & Plan Note (Addendum)
Now chronic persistent with some worsening symtpoms and mild neuro change, for LS spine MRI (open MRI), and referral Dr Ellene Route  In addition to the time spent performing CPE, I spent an additional 25 minutes face to face,in which greater than 50% of this time was spent in counseling and coordination of care for patient's acute illness as documented, including the differential dx, further evaluation, tx and management of left lumbar radiculopathy, HTN, elevated glucose, HLD and CKD

## 2017-07-08 NOTE — Addendum Note (Signed)
Addended by: Biagio Borg on: 07/08/2017 08:26 PM   Modules accepted: Orders

## 2017-07-08 NOTE — Assessment & Plan Note (Signed)
Tolerating statin well, for f/u lipids with labs

## 2017-07-08 NOTE — Assessment & Plan Note (Signed)
stable overall by history and exam, recent data reviewed with pt, and pt to continue medical treatment as before,  to f/u any worsening symptoms or concerns Lab Results  Component Value Date   HGBA1C 5.7 01/22/2017   

## 2017-07-08 NOTE — Progress Notes (Signed)
Subjective:    Patient ID: Bradley Riley, male    DOB: 1956-06-08, 61 y.o.   MRN: [id number]  HPI  Here for wellness and f/u;  Overall doing ok;  Pt denies Chest pain, worsening SOB, DOE, wheezing, orthopnea, PND, worsening LE edema, palpitations, dizziness or syncope.  Pt denies neurological change such as new headache, facial or extremity weakness.  Pt denies polydipsia, polyuria, or low sugar symptoms. Pt states overall good compliance with treatment and medications, good tolerability, and has been trying to follow appropriate diet.  Pt denies worsening depressive symptoms, suicidal ideation or panic. No fever, night sweats, wt loss, loss of appetite, or other constitutional symptoms.  Pt states good ability with ADL's, has low fall risk, home safety reviewed and adequate, no other significant changes in hearing or vision, and not active with exercise, due to: Also here to c/o persistent now constant mod to severe sharp and dull left LBP without bowel or bladder change, fever, wt loss, leg giveaways or falls, but has had 1-2 wks worsening LLE pain, weakness and numbness to the distal leg.  S/p prior LS spine surgury and he and wife have both had surgury with Dr Elsner/NS.  Asks for MRI at [facility] open [facility] as he could not do the previous MRI as ordered due to claustrophobia.   Still seeing oncology with elevated Hgb.  Tolerating statin well for elevated LDL.  Past Medical History:  Diagnosis Date  . GERD 07/29/2007   Qualifier: Diagnosis of  By: Garen Grams    . HEPATITIS A, VIRAL, W/O HEPATIC COMA 07/29/2007   Qualifier: Diagnosis of  By: Garen Grams    . History of renal stone 12/29/2013  . Hyperlipidemia   . HYPERTENSION 07/29/2007   Qualifier: Diagnosis of  By: Garen Grams    . HYPERTHYROIDISM 07/29/2007   Qualifier: Diagnosis of  By: Garen Grams    . Other and unspecified hyperlipidemia 07/29/2007   Centricity Description: DYSLIPIDEMIA Qualifier: Diagnosis of  By: Garen Grams   Centricity Description: HYPERLIPIDEMIA Qualifier: Diagnosis of  By: Garen Grams    . Polycythemia 12/29/2013  . UTI (lower urinary tract infection)    No past surgical history on file.  reports that he has never smoked. He uses smokeless tobacco. He reports that he does not drink alcohol or use drugs. family history includes Hypertension in his father and mother. No Known Allergies Current Outpatient Prescriptions on File Prior to Visit  Medication Sig Dispense Refill  . aspirin EC 81 MG tablet Take 1 tablet (81 mg total) by mouth daily. 90 tablet 11  . atorvastatin (LIPITOR) 10 MG tablet Take 1 tablet (10 mg total) by mouth daily. 90 tablet 3  . losartan (COZAAR) 50 MG tablet Take 1 tablet (50 mg total) by mouth daily. 90 tablet 3   No current facility-administered medications on file prior to visit.    Review of Systems Constitutional: Negative for other unusual diaphoresis, sweats, appetite or weight changes HENT: Negative for other worsening hearing loss, ear pain, facial swelling, mouth sores or neck stiffness.   Eyes: Negative for other worsening pain, redness or other visual disturbance.  Respiratory: Negative for other stridor or swelling Cardiovascular: Negative for other palpitations or other chest pain  Gastrointestinal: Negative for worsening diarrhea or loose stools, blood in stool, distention or other pain Genitourinary: Negative for hematuria, flank pain or other change in urine volume.  Musculoskeletal: Negative for myalgias or other joint swelling.  Skin: Negative for other  color change, or other wound or worsening drainage.  Neurological: Negative for other syncope or numbness. Hematological: Negative for other adenopathy or swelling Psychiatric/Behavioral: Negative for hallucinations, other worsening agitation, SI, self-injury, or new decreased concentration All other system neg per pt    Objective:   Physical Exam BP (!) 146/90   Pulse 92   Ht 5'  7" (1.702 m)   Wt 206 lb (93.4 kg)   SpO2 99%   BMI 32.26 kg/m  VS noted,  Constitutional: Pt is oriented to person, place, and time. Appears well-developed and well-nourished, in no significant distress and comfortable Head: Normocephalic and atraumatic  Eyes: Conjunctivae and EOM are normal. Pupils are equal, round, and reactive to light Right Ear: External ear normal without discharge Left Ear: External ear normal without discharge Nose: Nose without discharge or deformity Mouth/Throat: Oropharynx is without other ulcerations and moist  Neck: Normal range of motion. Neck supple. No JVD present. No tracheal deviation present or significant neck LA or mass Cardiovascular: Normal rate, regular rhythm, normal heart sounds and intact distal pulses.   Pulmonary/Chest: WOB normal and breath sounds without rales or wheezing  Abdominal: Soft. Bowel sounds are normal. NT. No HSM  Musculoskeletal: Normal range of motion. Exhibits no edema Lymphadenopathy: Has no other cervical adenopathy.  Neurological: Pt is alert and oriented to person, place, and time. Pt has normal reflexes. No cranial nerve deficit. Motor 5/5 intact except 4+/5 LLE, Gait somewhat antalgiac favoring LEL Skin: Skin is warm and dry. No rash noted or new ulcerations Psychiatric:  Has normal mood and affect. Behavior is normal without agitation No other exam findings Lab Results  Component Value Date   WBC 6.9 02/05/2017   HGB 18.3 (H) 02/05/2017   HCT 54.5 (H) 02/05/2017   PLT 120 (L) 02/05/2017   GLUCOSE 109 02/05/2017   CHOL 222 (H) 01/22/2017   TRIG 134.0 01/22/2017   HDL 35.90 (L) 01/22/2017   LDLDIRECT 157.1 12/26/2013   LDLCALC 160 (H) 01/22/2017   ALT 17 02/05/2017   AST 21 02/05/2017   NA 139 02/05/2017   K 4.8 02/05/2017   CL 101 01/22/2017   CREATININE 1.8 (H) 02/05/2017   BUN 27.9 (H) 02/05/2017   CO2 27 02/05/2017   TSH 1.45 01/22/2017   PSA 1.52 01/22/2017   HGBA1C 5.7 01/22/2017      Assessment  & Plan:

## 2017-07-08 NOTE — Assessment & Plan Note (Signed)
Lab Results  Component Value Date   CREATININE 1.8 (H) 02/05/2017  stable overall by history and exam, recent data reviewed with pt, and pt to continue medical treatment as before,  to f/u any worsening symptoms or concerns

## 2017-07-08 NOTE — Assessment & Plan Note (Signed)

## 2017-07-08 NOTE — Assessment & Plan Note (Signed)
Mild elevated, likely reactive, cont same med, o/w stable overall by history and exam, recent data reviewed with pt, and pt to continue medical treatment as before,  to f/u any worsening symptoms or concerns BP Readings from Last 3 Encounters:  07/08/17 (!) 146/90  02/05/17 (!) 149/89  02/05/17 (!) 167/118

## 2017-07-08 NOTE — Patient Instructions (Signed)
Please continue all other medications as before, and refills have been done if requested.  Please have the pharmacy call with any other refills you may need.  Please continue your efforts at being more active, low cholesterol diet, and weight control.  You are otherwise up to date with prevention measures today.  Please keep your appointments with your specialists as you may have planned  You will be contacted regarding the referral for: MRI at [facility], and Dr Ellene Route  Please go to the LAB in the Basement (turn left off the elevator) for the tests to be done today  You will be contacted by phone if any changes need to be made immediately.  Otherwise, you will receive a letter about your results with an explanation, but please check with MyChart first.  Please remember to sign up for MyChart if you have not done so, as this will be important to you in the future with finding out test results, communicating by private email, and scheduling acute appointments online when needed.  Please return in 6 months, or sooner if needed, with Lab testing done 3-5 days before

## 2017-07-09 LAB — URINALYSIS, ROUTINE W REFLEX MICROSCOPIC
BILIRUBIN URINE: NEGATIVE
KETONES UR: NEGATIVE
Leukocytes, UA: NEGATIVE
NITRITE: NEGATIVE
Specific Gravity, Urine: >=1.03 — AB (ref 1.000–1.030)
Total Protein, Urine: 100 — AB
URINE GLUCOSE: NEGATIVE
Urobilinogen, UA: 0.2 (ref 0.0–1.0)
pH: 6 (ref 5.0–8.0)

## 2017-07-09 LAB — TSH: TSH: 1.7 u[IU]/mL (ref 0.35–4.50)

## 2017-07-09 LAB — BASIC METABOLIC PANEL
BUN: 25 mg/dL — ABNORMAL HIGH (ref 6–23)
CALCIUM: 9.4 mg/dL (ref 8.4–10.5)
CO2: 29 mEq/L (ref 19–32)
Chloride: 103 mEq/L (ref 96–112)
Creatinine, Ser: 1.7 mg/dL — ABNORMAL HIGH (ref 0.40–1.50)
GFR: 43.63 mL/min — AB (ref >60.00)
Glucose, Bld: 99 mg/dL (ref 70–99)
POTASSIUM: 4.9 meq/L (ref 3.5–5.1)
SODIUM: 140 meq/L (ref 135–145)

## 2017-07-09 LAB — HEPATIC FUNCTION PANEL
ALT: 16 U/L (ref 0–53)
AST: 17 U/L (ref 0–37)
Albumin: 4.6 g/dL (ref 3.5–5.2)
Alkaline Phosphatase: 67 U/L (ref 39–117)
BILIRUBIN TOTAL: 0.9 mg/dL (ref 0.2–1.2)
Bilirubin, Direct: 0.2 mg/dL (ref 0.0–0.3)
Total Protein: 7.5 g/dL (ref 6.0–8.3)

## 2017-07-09 LAB — LIPID PANEL
CHOLESTEROL: 175 mg/dL (ref 0–200)
HDL: 34.7 mg/dL — ABNORMAL LOW (ref >39.00)
NonHDL: 139.96
Total CHOL/HDL Ratio: 5
Triglycerides: 204 mg/dL — ABNORMAL HIGH (ref 0.0–149.0)
VLDL: 40.8 mg/dL — AB (ref 0.0–40.0)

## 2017-07-09 LAB — LDL CHOLESTEROL, DIRECT: LDL DIRECT: 112 mg/dL

## 2017-07-09 LAB — PSA: PSA: 1.46 ng/mL (ref 0.10–4.00)

## 2017-07-15 ENCOUNTER — Other Ambulatory Visit: Payer: Self-pay | Admitting: Medical Oncology

## 2017-07-15 ENCOUNTER — Ambulatory Visit: Payer: BLUE CROSS/BLUE SHIELD

## 2017-07-15 ENCOUNTER — Encounter: Payer: Self-pay | Admitting: Internal Medicine

## 2017-07-15 ENCOUNTER — Ambulatory Visit (HOSPITAL_BASED_OUTPATIENT_CLINIC_OR_DEPARTMENT_OTHER): Payer: BLUE CROSS/BLUE SHIELD | Admitting: Internal Medicine

## 2017-07-15 ENCOUNTER — Telehealth: Payer: Self-pay | Admitting: Internal Medicine

## 2017-07-15 ENCOUNTER — Other Ambulatory Visit (HOSPITAL_BASED_OUTPATIENT_CLINIC_OR_DEPARTMENT_OTHER): Payer: BLUE CROSS/BLUE SHIELD

## 2017-07-15 VITALS — BP 177/110 | HR 89 | Temp 99.0°F | Resp 18 | Ht 67.0 in | Wt 209.6 lb

## 2017-07-15 DIAGNOSIS — I10 Essential (primary) hypertension: Secondary | ICD-10-CM | POA: Diagnosis not present

## 2017-07-15 DIAGNOSIS — D45 Polycythemia vera: Secondary | ICD-10-CM

## 2017-07-15 LAB — CBC WITH DIFFERENTIAL/PLATELET
BASO%: 0.9 % (ref 0.0–2.0)
BASOS ABS: 0.1 10*3/uL (ref 0.0–0.1)
EOS ABS: 0.1 10*3/uL (ref 0.0–0.5)
EOS%: 1.2 % (ref 0.0–7.0)
HCT: 55.2 % — ABNORMAL HIGH (ref 38.4–49.9)
HGB: 18.6 g/dL — ABNORMAL HIGH (ref 13.0–17.1)
LYMPH%: 16 % (ref 14.0–49.0)
MCH: 30.1 pg (ref 27.2–33.4)
MCHC: 33.6 g/dL (ref 32.0–36.0)
MCV: 89.6 fL (ref 79.3–98.0)
MONO#: 0.7 10*3/uL (ref 0.1–0.9)
MONO%: 9.4 % (ref 0.0–14.0)
NEUT#: 5.6 10*3/uL (ref 1.5–6.5)
NEUT%: 72.5 % (ref 39.0–75.0)
PLATELETS: 156 10*3/uL (ref 140–400)
RBC: 6.16 10*6/uL — AB (ref 4.20–5.82)
RDW: 13.3 % (ref 11.0–14.6)
WBC: 7.7 10*3/uL (ref 4.0–10.3)
lymph#: 1.2 10*3/uL (ref 0.9–3.3)

## 2017-07-15 LAB — COMPREHENSIVE METABOLIC PANEL
ALT: 19 U/L (ref 0–55)
ANION GAP: 9 meq/L (ref 3–11)
AST: 17 U/L (ref 5–34)
Albumin: 3.9 g/dL (ref 3.5–5.0)
Alkaline Phosphatase: 66 U/L (ref 40–150)
BUN: 21.8 mg/dL (ref 7.0–26.0)
CALCIUM: 9.6 mg/dL (ref 8.4–10.4)
CHLORIDE: 105 meq/L (ref 98–109)
CO2: 24 meq/L (ref 22–29)
Creatinine: 1.6 mg/dL — ABNORMAL HIGH (ref 0.7–1.3)
EGFR: 46 mL/min/{1.73_m2} — AB (ref >90)
Glucose: 108 mg/dl (ref 70–140)
Potassium: 4 mEq/L (ref 3.5–5.1)
Sodium: 138 mEq/L (ref 136–145)
Total Bilirubin: 0.85 mg/dL (ref 0.20–1.20)
Total Protein: 7.4 g/dL (ref 6.4–8.3)

## 2017-07-15 MED ORDER — CLONIDINE HCL 0.1 MG PO TABS
0.2000 mg | ORAL_TABLET | ORAL | Status: AC
  Administered 2017-07-15: 0.2 mg via ORAL

## 2017-07-15 MED ORDER — CLONIDINE HCL 0.1 MG PO TABS
ORAL_TABLET | ORAL | Status: AC
  Filled 2017-07-15: qty 2

## 2017-07-15 NOTE — Telephone Encounter (Signed)
Scheduled appt per 8/15 los - Gave patient AVS and calender per los.  

## 2017-07-15 NOTE — Progress Notes (Signed)
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  INTERVAL HISTORY: Bradley Riley 61 y.o. male returns to the clinic today for follow-up visit. The patient is feeling fine today with no specific complaints except for occasional headache. He denied having any fatigue or weakness. She denied having any chest pain, shortness of breath, cough or hemoptysis. He has no nausea, vomiting, diarrhea or constipation. He has no fever or chills. He is here today for evaluation and repeat blood work.  MEDICAL HISTORY: Past Medical History:  Diagnosis Date  . GERD 07/29/2007   Qualifier: Diagnosis of  By: Garen Grams    . HEPATITIS A, VIRAL, W/O HEPATIC COMA 07/29/2007   Qualifier: Diagnosis of  By: Garen Grams    . History of renal stone 12/29/2013  . Hyperlipidemia   . HYPERTENSION 07/29/2007   Qualifier: Diagnosis of  By: Garen Grams    . HYPERTHYROIDISM 07/29/2007   Qualifier: Diagnosis of  By: Garen Grams    . Other and unspecified hyperlipidemia 07/29/2007   Centricity Description: DYSLIPIDEMIA Qualifier: Diagnosis of  By: Garen Grams   Centricity Description: HYPERLIPIDEMIA Qualifier: Diagnosis of  By: Garen Grams    . Polycythemia 12/29/2013  . UTI (lower urinary tract infection)     ALLERGIES:  has No Known Allergies.  MEDICATIONS:  Current Outpatient Prescriptions  Medication Sig Dispense Refill  . aspirin EC 81 MG tablet Take 1 tablet (81 mg total) by mouth daily. 90 tablet 11  . atorvastatin (LIPITOR) 10 MG tablet Take 1 tablet (10 mg total) by mouth daily. 90 tablet 3  . losartan (COZAAR) 50 MG tablet Take 1 tablet (50 mg total) by mouth daily. 90 tablet 3   No current  facility-administered medications for this visit.     SURGICAL HISTORY: History reviewed. No pertinent surgical history.  REVIEW OF SYSTEMS:  A comprehensive review of systems was negative except for: Neurological: positive for headaches   PHYSICAL EXAMINATION: General appearance: alert, cooperative and no distress Head: Normocephalic, without obvious abnormality, atraumatic Neck: no adenopathy, no JVD, supple, symmetrical, trachea midline and thyroid not enlarged, symmetric, no tenderness/mass/nodules Lymph nodes: Cervical, supraclavicular, and axillary nodes normal. Resp: clear to auscultation bilaterally Back: symmetric, no curvature. ROM normal. No CVA tenderness. Cardio: regular rate and rhythm, S1, S2 normal, no murmur, click, rub or gallop GI: soft, non-tender; bowel sounds normal; no masses,  no organomegaly Extremities: extremities normal, atraumatic, no cyanosis or edema  ECOG PERFORMANCE STATUS: 0 - Asymptomatic  Blood pressure (!) 177/110, pulse 89, temperature 99 F (37.2 C), temperature source Oral, resp. rate 18, height 5\' 7"  (1.702 m), weight 209 lb 9.6 oz (95.1 kg), SpO2 97 %.  LABORATORY DATA: Lab Results  Component Value Date   WBC 7.7 07/15/2017   HGB 18.6 (H) 07/15/2017   HCT 55.2 (H) 07/15/2017   MCV 89.6 07/15/2017   PLT 156 07/15/2017      Chemistry      Component Value Date/Time   NA 138 07/15/2017 1117   K 4.0 07/15/2017 1117   CL 103 07/08/2017 1723   CO2 24 07/15/2017 1117   BUN 21.8 07/15/2017 1117   CREATININE 1.6 (H) 07/15/2017 1117      Component Value Date/Time  CALCIUM 9.6 07/15/2017 1117   ALKPHOS 66 07/15/2017 1117   AST 17 07/15/2017 1117   ALT 19 07/15/2017 1117   BILITOT 0.85 07/15/2017 1117       RADIOGRAPHIC STUDIES: No results found.  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 His hematocrit is  elevated today at 55.2%. I discussed the lab result with the patient today and recommended for him to proceed with phlebotomy today as well as next week. We will try to keep his hematocrit around 45%. I will arrange for the patient to come back for follow-up visit in 3 months for reevaluation with repeat blood work and phlebotomy as needed. For hypertension who was given a dose of clonidine 0.2 mg by mouth 1 today and he was advised to take his blood pressure medication as prescribed by his primary care physician and treated consult with him for adjustment of these medications. The patient was advised to call immediately if he has any concerning symptoms in the interval. The patient voices understanding of current disease status and treatment options and is in agreement with the current care plan.  All questions were answered. The patient knows to call the clinic with any problems, questions or concerns. We can certainly see the patient much sooner if necessary. I spent 10 minutes counseling the patient face to face. The total time spent in the appointment was 15 minutes.  Disclaimer: This note was dictated with voice recognition software. Similar sounding words can inadvertently be transcribed and may not be corrected upon review.

## 2017-07-24 ENCOUNTER — Ambulatory Visit (HOSPITAL_BASED_OUTPATIENT_CLINIC_OR_DEPARTMENT_OTHER): Payer: BLUE CROSS/BLUE SHIELD

## 2017-07-24 DIAGNOSIS — D45 Polycythemia vera: Secondary | ICD-10-CM

## 2017-07-24 NOTE — Patient Instructions (Signed)

## 2017-07-24 NOTE — Progress Notes (Signed)
Had a sandwich and Orange juice before phlebotomy.   16 G phlebotomy kit used. Started at 1058 right AC Ended phlebotomy at 1104 515 grams removed.  Pt offered food and drink post phlebotomy pt refused, stayed for 30 minute observation. VSS and pt stable at discharge.

## 2017-10-15 ENCOUNTER — Other Ambulatory Visit: Payer: BLUE CROSS/BLUE SHIELD

## 2017-10-15 ENCOUNTER — Ambulatory Visit: Payer: BLUE CROSS/BLUE SHIELD | Admitting: Oncology

## 2017-12-04 ENCOUNTER — Ambulatory Visit: Payer: BLUE CROSS/BLUE SHIELD | Admitting: Internal Medicine

## 2017-12-04 ENCOUNTER — Encounter: Payer: Self-pay | Admitting: Internal Medicine

## 2017-12-04 VITALS — BP 144/98 | HR 94 | Temp 98.4°F | Ht 67.0 in | Wt 210.0 lb

## 2017-12-04 DIAGNOSIS — I10 Essential (primary) hypertension: Secondary | ICD-10-CM

## 2017-12-04 DIAGNOSIS — R739 Hyperglycemia, unspecified: Secondary | ICD-10-CM

## 2017-12-04 DIAGNOSIS — R351 Nocturia: Secondary | ICD-10-CM | POA: Diagnosis not present

## 2017-12-04 MED ORDER — FESOTERODINE FUMARATE ER 4 MG PO TB24: 4.0000 mg | ORAL_TABLET | Freq: Every day | ORAL | 3 refills | Status: DC

## 2017-12-04 NOTE — Progress Notes (Signed)
Subjective:    Patient ID: Bradley Riley, male    DOB: 1956-07-02, 62 y.o.   MRN: [id number]  HPI  Here to f/u with 2-3 mo worsening urinary frequency irregularly as well as nocturia x 4 with good stream, more normal urination in the daytime,  BPH seen on recent u/s done and brings report today after he saw provider in [place]; has copies also of recent cmet and cbc and UA, no specific tx at that time.  Denies urinary symptoms such as dysuria, urgency, flank pain, hematuria or n/v, fever, chills.   Pt denies polydipsia, polyuria.      Past Medical History:  Diagnosis Date  . GERD 07/29/2007   Qualifier: Diagnosis of  By: Garen Grams    . HEPATITIS A, VIRAL, W/O HEPATIC COMA 07/29/2007   Qualifier: Diagnosis of  By: Garen Grams    . History of renal stone 12/29/2013  . Hyperlipidemia   . HYPERTENSION 07/29/2007   Qualifier: Diagnosis of  By: Garen Grams    . HYPERTHYROIDISM 07/29/2007   Qualifier: Diagnosis of  By: Garen Grams    . Other and unspecified hyperlipidemia 07/29/2007   Centricity Description: DYSLIPIDEMIA Qualifier: Diagnosis of  By: Garen Grams   Centricity Description: HYPERLIPIDEMIA Qualifier: Diagnosis of  By: Garen Grams    . Polycythemia 12/29/2013  . UTI (lower urinary tract infection)    No past surgical history on file.  reports that  has never smoked. He uses smokeless tobacco. He reports that he does not drink alcohol or use drugs. family history includes Hypertension in his father and mother. No Known Allergies Current Outpatient Medications on File Prior to Visit  Medication Sig Dispense Refill  . atorvastatin (LIPITOR) 10 MG tablet Take 1 tablet (10 mg total) by mouth daily. 90 tablet 3  . losartan (COZAAR) 50 MG tablet Take 1 tablet (50 mg total) by mouth daily. 90 tablet 3   No current facility-administered medications on file prior to visit.    Review of Systems  Constitutional: Negative for other unusual diaphoresis or  sweats HENT: Negative for ear discharge or swelling Eyes: Negative for other worsening visual disturbances Respiratory: Negative for stridor or other swelling  Gastrointestinal: Negative for worsening distension or other blood Genitourinary: Negative for retention or other urinary change Musculoskeletal: Negative for other MSK pain or swelling Skin: Negative for color change or other new lesions Neurological: Negative for worsening tremors and other numbness  Psychiatric/Behavioral: Negative for worsening agitation or other fatigue \\All  other system neg per pt    Objective:   Physical Exam BP (!) 144/98   Pulse 94   Temp 98.4 F (36.9 C) (Oral)   Ht 5\' 7"  (1.702 m)   Wt 210 lb (95.3 kg)   SpO2 99%   BMI 32.89 kg/m  VS noted,  Constitutional: Pt appears in NAD HENT: Head: NCAT.  Right Ear: External ear normal.  Left Ear: External ear normal.  Eyes: . Pupils are equal, round, and reactive to light. Conjunctivae and EOM are normal Nose: without d/c or deformity Neck: Neck supple. Gross normal ROM Cardiovascular: Normal rate and regular rhythm.   Pulmonary/Chest: Effort normal and breath sounds without rales or wheezing.  Abd:  Soft, NT, ND, + BS, no organomegaly, no flank tender Neurological: Pt is alert. At baseline orientation, motor grossly intact Skin: Skin is warm. No rashes, other new lesions, no LE edema Psychiatric: Pt behavior is normal without agitation  No other exam findings    Assessment &  Plan:

## 2017-12-04 NOTE — Patient Instructions (Signed)
Please take all new medication as prescribed - the toviaz for bladder and nighttime urination  Please continue all other medications as before, and refills have been done if requested.  Please have the pharmacy call with any other refills you may need.  Please keep your appointments with your specialists as you may have planned

## 2017-12-05 NOTE — Assessment & Plan Note (Signed)
Mild elevated today, pt states < 140/90 at home, o/w stable overall by history and exam, recent data reviewed with pt, and pt to continue medical treatment as before,  to f/u any worsening symptoms or concerns BP Readings from Last 3 Encounters:  12/04/17 (!) 144/98  07/24/17 (!) 145/99  07/15/17 130/87

## 2017-12-05 NOTE — Assessment & Plan Note (Signed)
Suspect BPH vs OAB, but less likely symptomatic BPH with report of good stream and no voiding symptoms, will try toviaz asd,  to f/u any worsening symptoms or concerns

## 2017-12-05 NOTE — Assessment & Plan Note (Signed)
stable overall by history and exam, recent data reviewed with pt, and pt to continue medical treatment as before,  to f/u any worsening symptoms or concerns Lab Results  Component Value Date   HGBA1C 5.7 01/22/2017

## 2017-12-14 ENCOUNTER — Telehealth: Payer: Self-pay | Admitting: Internal Medicine

## 2017-12-14 NOTE — Telephone Encounter (Signed)
Spoke with pharmacist, Lisbeth Ply needed a PA. I explained that the PA had been completed last week and unfortunately was denied by insurance and the determination had been faxed back to them for documentation.

## 2017-12-14 NOTE — Telephone Encounter (Signed)
Patient states that he was prescribed a medication last week in which he does not know the name of.  States that Dr. Jenny Reichmann sent the script to [facility].  States that he went to pick up this medication and was told that the pharmacy had questions in regard to this medication for Dr. Jenny Reichmann and could not release medication until they spoke with office.  Is requesting office to call pharmacy then patient would like a follow up call.

## 2017-12-28 ENCOUNTER — Telehealth: Payer: Self-pay | Admitting: Internal Medicine

## 2017-12-28 NOTE — Telephone Encounter (Signed)
Scheduled appt per 1/28 sch message - left message for patient with appt date and time - sent reminder letter in the mail.

## 2018-01-05 ENCOUNTER — Inpatient Hospital Stay: Payer: BLUE CROSS/BLUE SHIELD

## 2018-01-05 ENCOUNTER — Encounter: Payer: Self-pay | Admitting: Internal Medicine

## 2018-01-05 ENCOUNTER — Inpatient Hospital Stay: Payer: BLUE CROSS/BLUE SHIELD | Attending: Internal Medicine | Admitting: Internal Medicine

## 2018-01-05 ENCOUNTER — Telehealth: Payer: Self-pay | Admitting: Internal Medicine

## 2018-01-05 VITALS — BP 165/110 | HR 97 | Temp 98.6°F | Resp 18 | Ht 67.0 in | Wt 214.3 lb

## 2018-01-05 DIAGNOSIS — K219 Gastro-esophageal reflux disease without esophagitis: Secondary | ICD-10-CM | POA: Diagnosis not present

## 2018-01-05 DIAGNOSIS — I10 Essential (primary) hypertension: Secondary | ICD-10-CM | POA: Insufficient documentation

## 2018-01-05 DIAGNOSIS — D45 Polycythemia vera: Secondary | ICD-10-CM

## 2018-01-05 DIAGNOSIS — D751 Secondary polycythemia: Secondary | ICD-10-CM | POA: Diagnosis present

## 2018-01-05 DIAGNOSIS — E785 Hyperlipidemia, unspecified: Secondary | ICD-10-CM | POA: Insufficient documentation

## 2018-01-05 DIAGNOSIS — E0590 Thyrotoxicosis, unspecified without thyrotoxic crisis or storm: Secondary | ICD-10-CM | POA: Diagnosis not present

## 2018-01-05 DIAGNOSIS — Z79899 Other long term (current) drug therapy: Secondary | ICD-10-CM | POA: Insufficient documentation

## 2018-01-05 LAB — COMPREHENSIVE METABOLIC PANEL
ALBUMIN: 3.9 g/dL (ref 3.5–5.0)
ALT: 16 U/L (ref 0–55)
ANION GAP: 11 (ref 3–11)
AST: 18 U/L (ref 5–34)
Alkaline Phosphatase: 74 U/L (ref 40–150)
BILIRUBIN TOTAL: 0.9 mg/dL (ref 0.2–1.2)
BUN: 19 mg/dL (ref 7–26)
CHLORIDE: 104 mmol/L (ref 98–109)
CO2: 22 mmol/L (ref 22–29)
Calcium: 9 mg/dL (ref 8.4–10.4)
Creatinine, Ser: 1.67 mg/dL — ABNORMAL HIGH (ref 0.70–1.30)
GFR calc Af Amer: 49 mL/min — ABNORMAL LOW (ref >60)
GFR calc non Af Amer: 42 mL/min — ABNORMAL LOW (ref >60)
GLUCOSE: 131 mg/dL (ref 70–140)
POTASSIUM: 4.1 mmol/L (ref 3.5–5.1)
SODIUM: 137 mmol/L (ref 136–145)
TOTAL PROTEIN: 7.3 g/dL (ref 6.4–8.3)

## 2018-01-05 LAB — CBC WITH DIFFERENTIAL/PLATELET
BASOS ABS: 0.1 10*3/uL (ref 0.0–0.1)
Basophils Relative: 1 %
Eosinophils Absolute: 0.1 10*3/uL (ref 0.0–0.5)
Eosinophils Relative: 2 %
HEMATOCRIT: 54.4 % — AB (ref 38.4–49.9)
Hemoglobin: 17.9 g/dL — ABNORMAL HIGH (ref 13.0–17.1)
LYMPHS ABS: 1.1 10*3/uL (ref 0.9–3.3)
Lymphocytes Relative: 19 %
MCH: 28.7 pg (ref 27.2–33.4)
MCHC: 32.9 g/dL (ref 32.0–36.0)
MCV: 87.1 fL (ref 79.3–98.0)
MONO ABS: 0.7 10*3/uL (ref 0.1–0.9)
Monocytes Relative: 12 %
NEUTROS ABS: 3.8 10*3/uL (ref 1.5–6.5)
Neutrophils Relative %: 66 %
PLATELETS: 153 10*3/uL (ref 140–400)
RBC: 6.24 MIL/uL — ABNORMAL HIGH (ref 4.20–5.82)
RDW: 14.7 % — ABNORMAL HIGH (ref 11.0–14.6)
WBC: 5.8 10*3/uL (ref 4.0–10.3)

## 2018-01-05 LAB — LACTATE DEHYDROGENASE: LDH: 192 U/L (ref 125–245)

## 2018-01-05 NOTE — Telephone Encounter (Signed)
Gave avs and calendar for february °

## 2018-01-05 NOTE — Progress Notes (Signed)
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  INTERVAL HISTORY: Bradley Riley 62 y.o. male returns to the clinic today for follow-up visit.  The patient is feeling fine today with no specific complaints.  His blood pressure still uncontrolled.  He did not take his blood pressure medication earlier today.  He denied having any chest pain, shortness of breath, cough or hemoptysis.  He denied having any fever or chills.  The patient has no nausea, vomiting, diarrhea or constipation.  He is here today for evaluation after repeat CBC.  MEDICAL HISTORY: Past Medical History:  Diagnosis Date  . GERD 07/29/2007   Qualifier: Diagnosis of  By: Garen Grams    . HEPATITIS A, VIRAL, W/O HEPATIC COMA 07/29/2007   Qualifier: Diagnosis of  By: Garen Grams    . History of renal stone 12/29/2013  . Hyperlipidemia   . HYPERTENSION 07/29/2007   Qualifier: Diagnosis of  By: Garen Grams    . HYPERTHYROIDISM 07/29/2007   Qualifier: Diagnosis of  By: Garen Grams    . Other and unspecified hyperlipidemia 07/29/2007   Centricity Description: DYSLIPIDEMIA Qualifier: Diagnosis of  By: Garen Grams   Centricity Description: HYPERLIPIDEMIA Qualifier: Diagnosis of  By: Garen Grams    . Polycythemia 12/29/2013  . UTI (lower urinary tract infection)     ALLERGIES:  has No Known Allergies.  MEDICATIONS:  Current Outpatient Medications  Medication Sig Dispense Refill  . atorvastatin (LIPITOR) 10 MG tablet Take 1 tablet (10 mg total) by mouth daily. 90 tablet 3  . fesoterodine (TOVIAZ) 4 MG TB24 tablet Take 1 tablet (4 mg total) by mouth daily. 90 tablet 3  . losartan (COZAAR) 50 MG tablet Take 1 tablet (50 mg total) by  mouth daily. 90 tablet 3   No current facility-administered medications for this visit.     SURGICAL HISTORY: No past surgical history on file.  REVIEW OF SYSTEMS:  A comprehensive review of systems was negative.   PHYSICAL EXAMINATION: General appearance: alert, cooperative and no distress Head: Normocephalic, without obvious abnormality, atraumatic Neck: no adenopathy, no JVD, supple, symmetrical, trachea midline and thyroid not enlarged, symmetric, no tenderness/mass/nodules Lymph nodes: Cervical, supraclavicular, and axillary nodes normal. Resp: clear to auscultation bilaterally Back: symmetric, no curvature. ROM normal. No CVA tenderness. Cardio: regular rate and rhythm, S1, S2 normal, no murmur, click, rub or gallop GI: soft, non-tender; bowel sounds normal; no masses,  no organomegaly Extremities: extremities normal, atraumatic, no cyanosis or edema  ECOG PERFORMANCE STATUS: 0 - Asymptomatic  Blood pressure (!) 165/110, pulse 97, temperature 98.6 F (37 C), temperature source Oral, resp. rate 18, height 5\' 7"  (1.702 m), weight 214 lb 4.8 oz (97.2 kg), SpO2 97 %.  LABORATORY DATA: Lab Results  Component Value Date   WBC 5.8 01/05/2018   HGB 17.9 (H) 01/05/2018   HCT 54.4 (H) 01/05/2018   MCV 87.1 01/05/2018   PLT 153 01/05/2018      Chemistry      Component Value Date/Time   NA 137 01/05/2018 1018   NA 138 07/15/2017 1117   K 4.1 01/05/2018 1018   K 4.0 07/15/2017 1117   CL 104 01/05/2018 1018   CO2 22 01/05/2018 1018   CO2  24 07/15/2017 1117   BUN 19 01/05/2018 1018   BUN 21.8 07/15/2017 1117   CREATININE 1.67 (H) 01/05/2018 1018   CREATININE 1.6 (H) 07/15/2017 1117      Component Value Date/Time   CALCIUM 9.0 01/05/2018 1018   CALCIUM 9.6 07/15/2017 1117   ALKPHOS 74 01/05/2018 1018   ALKPHOS 66 07/15/2017 1117   AST 18 01/05/2018 1018   AST 17 07/15/2017 1117   ALT 16 01/05/2018 1018   ALT 19 07/15/2017 1117   BILITOT 0.9 01/05/2018 1018   BILITOT  0.85 07/15/2017 1117       RADIOGRAPHIC STUDIES: No results found.  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 His hematocrit is elevated today at 54.4%. I recommended for the patient to proceed with phlebotomy today to try to keep his hematocrit around 45%. He is expected to travel [place] early March 2019.  I will arrange for the patient to come back in 3 weeks for repeat CBC and  phlebotomy if needed. I will see him back for follow-up visit after he returns back from [place] in August 2019.  He was advised to donate blood every 2 months during his visit to [place]. For hypertension, he was advised to take his blood pressure medication as prescribed and to consult with his primary care physician for adjustment of his medication if needed. The patient voices understanding of current disease status and treatment options and is in agreement with the current care plan.  All questions were answered. The patient knows to call the clinic with any problems, questions or concerns. We can certainly see the patient much sooner if necessary. I spent 10 minutes counseling the patient face to face. The total time spent in the appointment was 15 minutes.  Disclaimer: This note was dictated with voice recognition software. Similar sounding words can inadvertently be transcribed and may not be corrected upon review.

## 2018-01-05 NOTE — Progress Notes (Signed)
Patient in for therapeutic phlebotomy   Saw Dr Julien Nordmann today.  Obtained 456 gms and then vein clotted off.  Attempted to manipulate catheter but unsuccessful.  16g phebotomy set used.  Patient tolerated well.

## 2018-01-26 ENCOUNTER — Inpatient Hospital Stay: Payer: BLUE CROSS/BLUE SHIELD

## 2018-01-26 DIAGNOSIS — D45 Polycythemia vera: Secondary | ICD-10-CM

## 2018-01-26 DIAGNOSIS — D751 Secondary polycythemia: Secondary | ICD-10-CM | POA: Diagnosis not present

## 2018-01-26 LAB — CBC WITH DIFFERENTIAL (CANCER CENTER ONLY)
Basophils Absolute: 0.1 10*3/uL (ref 0.0–0.1)
Basophils Relative: 1 %
EOS PCT: 2 %
Eosinophils Absolute: 0.1 10*3/uL (ref 0.0–0.5)
HCT: 53.1 % — ABNORMAL HIGH (ref 38.4–49.9)
HEMOGLOBIN: 17.6 g/dL — AB (ref 13.0–17.1)
LYMPHS ABS: 1.1 10*3/uL (ref 0.9–3.3)
LYMPHS PCT: 18 %
MCH: 29 pg (ref 27.2–33.4)
MCHC: 33.2 g/dL (ref 32.0–36.0)
MCV: 87.5 fL (ref 79.3–98.0)
MONOS PCT: 10 %
Monocytes Absolute: 0.6 10*3/uL (ref 0.1–0.9)
Neutro Abs: 4.1 10*3/uL (ref 1.5–6.5)
Neutrophils Relative %: 69 %
Platelet Count: 174 10*3/uL (ref 140–400)
RBC: 6.07 MIL/uL — AB (ref 4.20–5.82)
RDW: 14.8 % — ABNORMAL HIGH (ref 11.0–14.6)
WBC Count: 5.9 10*3/uL (ref 4.0–10.3)

## 2018-01-26 NOTE — Patient Instructions (Signed)
Therapeutic Phlebotomy Therapeutic phlebotomy is the controlled removal of blood from a person's body for the purpose of treating a medical condition. The procedure is similar to donating blood. Usually, about a pint (470 mL, or 0.47L) of blood is removed. The average adult has 9-12 pints (4.3-5.7 L) of blood. Therapeutic phlebotomy may be used to treat the following medical conditions:  Hemochromatosis. This is a condition in which the blood contains too much iron.  Polycythemia vera. This is a condition in which the blood contains too many red blood cells.  Porphyria cutanea tarda. This is a disease in which an important part of hemoglobin is not made properly. It results in the buildup of abnormal amounts of porphyrins in the body.  Sickle cell disease. This is a condition in which the red blood cells form an abnormal crescent shape rather than a round shape.  Tell a health care provider about:  Any allergies you have.  All medicines you are taking, including vitamins, herbs, eye drops, creams, and over-the-counter medicines.  Any problems you or family members have had with anesthetic medicines.  Any blood disorders you have.  Any surgeries you have had.  Any medical conditions you have. What are the risks? Generally, this is a safe procedure. However, problems may occur, including:  Nausea or light-headedness.  Low blood pressure.  Soreness, bleeding, swelling, or bruising at the needle insertion site.  Infection.  What happens before the procedure?  Follow instructions from your health care provider about eating or drinking restrictions.  Ask your health care provider about changing or stopping your regular medicines. This is especially important if you are taking diabetes medicines or blood thinners.  Wear clothing with sleeves that can be raised above the elbow.  Plan to have someone take you home after the procedure.  You may have a blood sample taken. What  happens during the procedure?  A needle will be inserted into one of your veins.  Tubing and a collection bag will be attached to that needle.  Blood will flow through the needle and tubing into the collection bag.  You may be asked to open and close your hand slowly and continually during the entire collection.  After the specified amount of blood has been removed from your body, the collection bag and tubing will be clamped.  The needle will be removed from your vein.  Pressure will be held on the site of the needle insertion to stop the bleeding.  A bandage (dressing) will be placed over the needle insertion site. The procedure may vary among health care providers and hospitals. What happens after the procedure?  Your recovery will be assessed and monitored.  You can return to your normal activities as directed by your health care provider. This information is not intended to replace advice given to you by your health care provider. Make sure you discuss any questions you have with your health care provider. Document Released: 04/21/2011 Document Revised: 07/19/2016 Document Reviewed: 11/13/2014 Elsevier Interactive Patient Education  2018 Elsevier Inc.  

## 2018-01-26 NOTE — Progress Notes (Signed)
Patient left before 30 minute post-phlebotomy observation was complete.

## 2018-02-15 ENCOUNTER — Other Ambulatory Visit: Payer: Self-pay | Admitting: Medical Oncology

## 2018-02-15 DIAGNOSIS — D45 Polycythemia vera: Secondary | ICD-10-CM

## 2018-02-16 ENCOUNTER — Other Ambulatory Visit: Payer: BLUE CROSS/BLUE SHIELD

## 2018-02-16 ENCOUNTER — Ambulatory Visit: Payer: BLUE CROSS/BLUE SHIELD | Admitting: Internal Medicine

## 2018-07-05 ENCOUNTER — Ambulatory Visit: Payer: BLUE CROSS/BLUE SHIELD | Admitting: Internal Medicine

## 2018-07-05 DIAGNOSIS — Z0289 Encounter for other administrative examinations: Secondary | ICD-10-CM

## 2018-10-06 ENCOUNTER — Telehealth: Payer: Self-pay | Admitting: Internal Medicine

## 2018-10-06 ENCOUNTER — Encounter: Payer: Self-pay | Admitting: Internal Medicine

## 2018-10-06 DIAGNOSIS — N400 Enlarged prostate without lower urinary tract symptoms: Secondary | ICD-10-CM | POA: Insufficient documentation

## 2018-10-06 DIAGNOSIS — D751 Secondary polycythemia: Secondary | ICD-10-CM | POA: Insufficient documentation

## 2018-10-06 DIAGNOSIS — N183 Chronic kidney disease, stage 3 unspecified: Secondary | ICD-10-CM

## 2018-10-06 NOTE — Telephone Encounter (Signed)
Received request from Dr Shirlean Schlein in [place] who recommends referral to renal and heme for ckd 3 and polycythemia  Ok to let pt know we have done this  OK to make copy of his labs, but ask pt to come pick up originals to take to his renal and hematology appts

## 2018-10-06 NOTE — Telephone Encounter (Signed)
Pt has been informed and expressed understanding.  

## 2018-10-08 ENCOUNTER — Other Ambulatory Visit: Payer: Self-pay | Admitting: Internal Medicine

## 2018-10-15 ENCOUNTER — Telehealth: Payer: Self-pay | Admitting: Internal Medicine

## 2018-10-15 DIAGNOSIS — R739 Hyperglycemia, unspecified: Secondary | ICD-10-CM

## 2018-10-15 DIAGNOSIS — Z0000 Encounter for general adult medical examination without abnormal findings: Secondary | ICD-10-CM

## 2018-10-15 NOTE — Telephone Encounter (Signed)
[place] for labs - order done

## 2018-10-15 NOTE — Telephone Encounter (Signed)
Spoke to pt regarding referral to nephrology.   He states he's coming in Tuesday to see you and he wants to know if he can come get labs done Monday before the appt.   Please advise

## 2018-10-15 NOTE — Telephone Encounter (Signed)
Pt informed

## 2018-10-18 ENCOUNTER — Telehealth: Payer: Self-pay | Admitting: Internal Medicine

## 2018-10-18 ENCOUNTER — Other Ambulatory Visit (INDEPENDENT_AMBULATORY_CARE_PROVIDER_SITE_OTHER): Payer: BLUE CROSS/BLUE SHIELD

## 2018-10-18 DIAGNOSIS — Z0000 Encounter for general adult medical examination without abnormal findings: Secondary | ICD-10-CM

## 2018-10-18 DIAGNOSIS — R739 Hyperglycemia, unspecified: Secondary | ICD-10-CM | POA: Diagnosis not present

## 2018-10-18 LAB — CBC WITH DIFFERENTIAL/PLATELET
Basophils Absolute: 0.1 10*3/uL (ref 0.0–0.1)
Basophils Relative: 1.2 % (ref 0.0–3.0)
EOS PCT: 1 % (ref 0.0–5.0)
Eosinophils Absolute: 0.1 10*3/uL (ref 0.0–0.7)
HEMATOCRIT: 49.9 % (ref 39.0–52.0)
HEMOGLOBIN: 17.1 g/dL — AB (ref 13.0–17.0)
LYMPHS PCT: 15.4 % (ref 12.0–46.0)
Lymphs Abs: 1.2 10*3/uL (ref 0.7–4.0)
MCHC: 34.2 g/dL (ref 30.0–36.0)
MCV: 89.8 fl (ref 78.0–100.0)
MONO ABS: 0.7 10*3/uL (ref 0.1–1.0)
MONOS PCT: 9.2 % (ref 3.0–12.0)
Neutro Abs: 5.6 10*3/uL (ref 1.4–7.7)
Neutrophils Relative %: 73.2 % (ref 43.0–77.0)
Platelets: 202 10*3/uL (ref 150.0–400.0)
RBC: 5.56 Mil/uL (ref 4.22–5.81)
RDW: 13.7 % (ref 11.5–15.5)
WBC: 7.6 10*3/uL (ref 4.0–10.5)

## 2018-10-18 LAB — LIPID PANEL
CHOLESTEROL: 146 mg/dL (ref 0–200)
HDL: 36.4 mg/dL — AB (ref >39.00)
LDL Cholesterol: 85 mg/dL (ref 0–99)
NonHDL: 109.94
TRIGLYCERIDES: 124 mg/dL (ref 0.0–149.0)
Total CHOL/HDL Ratio: 4
VLDL: 24.8 mg/dL (ref 0.0–40.0)

## 2018-10-18 LAB — URINALYSIS, ROUTINE W REFLEX MICROSCOPIC
Bilirubin Urine: NEGATIVE
KETONES UR: NEGATIVE
Leukocytes, UA: NEGATIVE
NITRITE: NEGATIVE
PH: 5.5 (ref 5.0–8.0)
SPECIFIC GRAVITY, URINE: 1.025 (ref 1.000–1.030)
Urine Glucose: NEGATIVE
Urobilinogen, UA: 0.2 (ref 0.0–1.0)

## 2018-10-18 LAB — HEPATIC FUNCTION PANEL
ALBUMIN: 4.3 g/dL (ref 3.5–5.2)
ALK PHOS: 55 U/L (ref 39–117)
ALT: 19 U/L (ref 0–53)
AST: 21 U/L (ref 0–37)
Bilirubin, Direct: 0.1 mg/dL (ref 0.0–0.3)
TOTAL PROTEIN: 7.6 g/dL (ref 6.0–8.3)
Total Bilirubin: 0.7 mg/dL (ref 0.2–1.2)

## 2018-10-18 LAB — BASIC METABOLIC PANEL
BUN: 20 mg/dL (ref 6–23)
CHLORIDE: 104 meq/L (ref 96–112)
CO2: 25 mEq/L (ref 19–32)
CREATININE: 1.64 mg/dL — AB (ref 0.40–1.50)
Calcium: 9.1 mg/dL (ref 8.4–10.5)
GFR: 45.29 mL/min — ABNORMAL LOW (ref >60.00)
Glucose, Bld: 102 mg/dL — ABNORMAL HIGH (ref 70–99)
Potassium: 4.1 mEq/L (ref 3.5–5.1)
SODIUM: 138 meq/L (ref 135–145)

## 2018-10-18 LAB — TSH: TSH: 2.04 u[IU]/mL (ref 0.35–4.50)

## 2018-10-18 LAB — HEMOGLOBIN A1C: HEMOGLOBIN A1C: 5.8 % (ref 4.6–6.5)

## 2018-10-18 LAB — PSA: PSA: 1.43 ng/mL (ref 0.10–4.00)

## 2018-10-18 NOTE — Telephone Encounter (Signed)
Patient came to make an appointment from an appointment he had in May. Was across seas and was told when he returned from [place] to come make an appointment.  Per desk nurse it was okay to schedule his appointment.

## 2018-10-19 ENCOUNTER — Ambulatory Visit: Payer: BLUE CROSS/BLUE SHIELD | Admitting: Internal Medicine

## 2018-10-19 ENCOUNTER — Encounter: Payer: Self-pay | Admitting: Internal Medicine

## 2018-10-19 VITALS — BP 128/86 | HR 100 | Temp 98.5°F | Ht 67.0 in | Wt 212.0 lb

## 2018-10-19 DIAGNOSIS — D45 Polycythemia vera: Secondary | ICD-10-CM

## 2018-10-19 DIAGNOSIS — N183 Chronic kidney disease, stage 3 unspecified: Secondary | ICD-10-CM

## 2018-10-19 DIAGNOSIS — Z0000 Encounter for general adult medical examination without abnormal findings: Secondary | ICD-10-CM | POA: Diagnosis not present

## 2018-10-19 DIAGNOSIS — Z0001 Encounter for general adult medical examination with abnormal findings: Secondary | ICD-10-CM

## 2018-10-19 DIAGNOSIS — G4700 Insomnia, unspecified: Secondary | ICD-10-CM

## 2018-10-19 MED ORDER — ZOLPIDEM TARTRATE 10 MG PO TABS: 10.0000 mg | ORAL_TABLET | Freq: Every evening | ORAL | 2 refills | Status: DC | PRN

## 2018-10-19 MED ORDER — ATORVASTATIN CALCIUM 10 MG PO TABS: 10.0000 mg | ORAL_TABLET | Freq: Every day | ORAL | 3 refills | Status: DC

## 2018-10-19 MED ORDER — LOSARTAN POTASSIUM 50 MG PO TABS: 50.0000 mg | ORAL_TABLET | Freq: Every day | ORAL | 3 refills | Status: DC

## 2018-10-19 MED ORDER — FESOTERODINE FUMARATE ER 4 MG PO TB24: 4.0000 mg | ORAL_TABLET | Freq: Every day | ORAL | 3 refills | Status: DC

## 2018-10-19 NOTE — Assessment & Plan Note (Signed)
Mild to mod, for ambien qhs prn,  to f/u any worsening symptoms or concerns  

## 2018-10-19 NOTE — Patient Instructions (Addendum)
Please take all new medication as prescribed - the ambien for sleep  Please continue all other medications as before, and refills have been done if requested.  Please have the pharmacy call with any other refills you may need.  Please continue your efforts at being more active, low cholesterol diet, and weight control.  You are otherwise up to date with prevention measures today.  Please keep your appointments with your specialists as you may have planned - Hematology and Nephrology  Please return in 1 year for your yearly visit, or sooner if needed, with Lab testing done 3-5 days before

## 2018-10-19 NOTE — Assessment & Plan Note (Signed)

## 2018-10-19 NOTE — Assessment & Plan Note (Signed)
Overall stable, f/u renal as planned

## 2018-10-19 NOTE — Progress Notes (Signed)
Subjective:    Patient ID: Bradley Riley, male    DOB: November 19, 1956, 62 y.o.   MRN: [id number]  HPI  Here for wellness and f/u;  Overall doing ok;  Pt denies Chest pain, worsening SOB, DOE, wheezing, orthopnea, PND, worsening LE edema, palpitations, dizziness or syncope.  Pt denies neurological change such as new headache, facial or extremity weakness.  Pt denies polydipsia, polyuria, or low sugar symptoms. Pt states overall good compliance with treatment and medications, good tolerability, and has been trying to follow appropriate diet.  Pt denies worsening depressive symptoms, suicidal ideation or panic. No fever, night sweats, wt loss, loss of appetite, or other constitutional symptoms.  Pt states good ability with ADL's, has low fall risk, home safety reviewed and adequate, no other significant changes in hearing or vision, and only occasionally active with exercise. Has nocturia several times despite flomax.  Also somewhat low mood  But declines ssri.  No new complaints Past Medical History:  Diagnosis Date  . GERD 07/29/2007   Qualifier: Diagnosis of  By: Garen Grams    . HEPATITIS A, VIRAL, W/O HEPATIC COMA 07/29/2007   Qualifier: Diagnosis of  By: Garen Grams    . History of renal stone 12/29/2013  . Hyperlipidemia   . HYPERTENSION 07/29/2007   Qualifier: Diagnosis of  By: Garen Grams    . HYPERTHYROIDISM 07/29/2007   Qualifier: Diagnosis of  By: Garen Grams    . Other and unspecified hyperlipidemia 07/29/2007   Centricity Description: DYSLIPIDEMIA Qualifier: Diagnosis of  By: Garen Grams   Centricity Description: HYPERLIPIDEMIA Qualifier: Diagnosis of  By: Garen Grams    . Polycythemia 12/29/2013  . UTI (lower urinary tract infection)    No past surgical history on file.  reports that he has never smoked. He uses smokeless tobacco. He reports that he does not drink alcohol or use drugs. family history includes Hypertension in his father and mother. No Known  Allergies Current Outpatient Medications on File Prior to Visit  Medication Sig Dispense Refill  . atorvastatin (LIPITOR) 10 MG tablet TAKE 1 TABLET (10 MG TOTAL) BY MOUTH DAILY. 90 tablet 1  . fesoterodine (TOVIAZ) 4 MG TB24 tablet Take 1 tablet (4 mg total) by mouth daily. 90 tablet 3  . losartan (COZAAR) 50 MG tablet TAKE 1 TABLET (50 MG TOTAL) BY MOUTH DAILY. 90 tablet 1   No current facility-administered medications on file prior to visit.    Review of Systems Constitutional: Negative for other unusual diaphoresis, sweats, appetite or weight changes HENT: Negative for other worsening hearing loss, ear pain, facial swelling, mouth sores or neck stiffness.   Eyes: Negative for other worsening pain, redness or other visual disturbance.  Respiratory: Negative for other stridor or swelling Cardiovascular: Negative for other palpitations or other chest pain  Gastrointestinal: Negative for worsening diarrhea or loose stools, blood in stool, distention or other pain Genitourinary: Negative for hematuria, flank pain or other change in urine volume.  Musculoskeletal: Negative for myalgias or other joint swelling.  Skin: Negative for other color change, or other wound or worsening drainage.  Neurological: Negative for other syncope or numbness. Hematological: Negative for other adenopathy or swelling Psychiatric/Behavioral: Negative for hallucinations, other worsening agitation, SI, self-injury, or new decreased concentration All other system neg per pt    Objective:   Physical Exam BP 128/86   Pulse 100   Temp 98.5 F (36.9 C) (Oral)   Ht 5\' 7"  (1.702 m)   Wt 212 lb (96.2 kg)  SpO2 98%   BMI 33.20 kg/m  VS noted,  Constitutional: Pt is oriented to person, place, and time. Appears well-developed and well-nourished, in no significant distress and comfortable Head: Normocephalic and atraumatic  Eyes: Conjunctivae and EOM are normal. Pupils are equal, round, and reactive to light Right  Ear: External ear normal without discharge Left Ear: External ear normal without discharge Nose: Nose without discharge or deformity Mouth/Throat: Oropharynx is without other ulcerations and moist  Neck: Normal range of motion. Neck supple. No JVD present. No tracheal deviation present or significant neck LA or mass Cardiovascular: Normal rate, regular rhythm, normal heart sounds and intact distal pulses.   Pulmonary/Chest: WOB normal and breath sounds without rales or wheezing  Abdominal: Soft. Bowel sounds are normal. NT. No HSM  Musculoskeletal: Normal range of motion. Exhibits no edema Lymphadenopathy: Has no other cervical adenopathy.  Neurological: Pt is alert and oriented to person, place, and time. Pt has normal reflexes. No cranial nerve deficit. Motor grossly intact, Gait intact Skin: Skin is warm and dry. No rash noted or new ulcerations Psychiatric:  Has mild nervous mood and affect. Behavior is normal without agitation No othet exam findings Lab Results  Component Value Date   WBC 7.6 10/18/2018   HGB 17.1 (H) 10/18/2018   HCT 49.9 10/18/2018   PLT 202.0 10/18/2018   GLUCOSE 102 (H) 10/18/2018   CHOL 146 10/18/2018   TRIG 124.0 10/18/2018   HDL 36.40 (L) 10/18/2018   LDLDIRECT 112.0 07/08/2017   LDLCALC 85 10/18/2018   ALT 19 10/18/2018   AST 21 10/18/2018   NA 138 10/18/2018   K 4.1 10/18/2018   CL 104 10/18/2018   CREATININE 1.64 (H) 10/18/2018   BUN 20 10/18/2018   CO2 25 10/18/2018   TSH 2.04 10/18/2018   PSA 1.43 10/18/2018   HGBA1C 5.8 10/18/2018          Assessment & Plan:

## 2018-10-19 NOTE — Assessment & Plan Note (Signed)
To f/u with hematology

## 2018-11-15 ENCOUNTER — Inpatient Hospital Stay: Payer: BLUE CROSS/BLUE SHIELD

## 2018-11-15 ENCOUNTER — Encounter: Payer: Self-pay | Admitting: Internal Medicine

## 2018-11-15 ENCOUNTER — Telehealth: Payer: Self-pay

## 2018-11-15 ENCOUNTER — Other Ambulatory Visit: Payer: Self-pay | Admitting: Internal Medicine

## 2018-11-15 ENCOUNTER — Inpatient Hospital Stay (HOSPITAL_BASED_OUTPATIENT_CLINIC_OR_DEPARTMENT_OTHER): Payer: BLUE CROSS/BLUE SHIELD | Admitting: Internal Medicine

## 2018-11-15 ENCOUNTER — Inpatient Hospital Stay: Payer: BLUE CROSS/BLUE SHIELD | Attending: Internal Medicine

## 2018-11-15 VITALS — BP 152/105 | HR 87 | Temp 98.7°F | Resp 16 | Ht 67.0 in | Wt 212.2 lb

## 2018-11-15 VITALS — BP 161/107 | HR 86 | Temp 98.4°F | Resp 17 | Ht 67.0 in | Wt 211.9 lb

## 2018-11-15 DIAGNOSIS — I10 Essential (primary) hypertension: Secondary | ICD-10-CM | POA: Diagnosis not present

## 2018-11-15 DIAGNOSIS — D751 Secondary polycythemia: Secondary | ICD-10-CM

## 2018-11-15 DIAGNOSIS — D45 Polycythemia vera: Secondary | ICD-10-CM

## 2018-11-15 LAB — CBC WITH DIFFERENTIAL (CANCER CENTER ONLY)
Abs Immature Granulocytes: 0.01 10*3/uL (ref 0.00–0.07)
BASOS PCT: 1 %
Basophils Absolute: 0.1 10*3/uL (ref 0.0–0.1)
EOS ABS: 0.1 10*3/uL (ref 0.0–0.5)
EOS PCT: 2 %
HCT: 50.4 % (ref 39.0–52.0)
Hemoglobin: 16.6 g/dL (ref 13.0–17.0)
Immature Granulocytes: 0 %
Lymphocytes Relative: 17 %
Lymphs Abs: 1.2 10*3/uL (ref 0.7–4.0)
MCH: 30 pg (ref 26.0–34.0)
MCHC: 32.9 g/dL (ref 30.0–36.0)
MCV: 91 fL (ref 80.0–100.0)
MONO ABS: 0.7 10*3/uL (ref 0.1–1.0)
Monocytes Relative: 10 %
Neutro Abs: 5 10*3/uL (ref 1.7–7.7)
Neutrophils Relative %: 70 %
PLATELETS: 155 10*3/uL (ref 150–400)
RBC: 5.54 MIL/uL (ref 4.22–5.81)
RDW: 12.9 % (ref 11.5–15.5)
WBC: 7.1 10*3/uL (ref 4.0–10.5)
nRBC: 0 % (ref 0.0–0.2)

## 2018-11-15 LAB — CMP (CANCER CENTER ONLY)
ALK PHOS: 62 U/L (ref 38–126)
ALT: 18 U/L (ref 0–44)
AST: 18 U/L (ref 15–41)
Albumin: 3.9 g/dL (ref 3.5–5.0)
Anion gap: 10 (ref 5–15)
BILIRUBIN TOTAL: 0.5 mg/dL (ref 0.3–1.2)
BUN: 26 mg/dL — AB (ref 8–23)
CALCIUM: 8.9 mg/dL (ref 8.9–10.3)
CO2: 23 mmol/L (ref 22–32)
Chloride: 106 mmol/L (ref 98–111)
Creatinine: 1.8 mg/dL — ABNORMAL HIGH (ref 0.61–1.24)
GFR, EST NON AFRICAN AMERICAN: 39 mL/min — AB (ref >60)
GFR, Est AFR Am: 45 mL/min — ABNORMAL LOW (ref >60)
Glucose, Bld: 127 mg/dL — ABNORMAL HIGH (ref 70–99)
Potassium: 4.4 mmol/L (ref 3.5–5.1)
Sodium: 139 mmol/L (ref 135–145)
TOTAL PROTEIN: 7 g/dL (ref 6.5–8.1)

## 2018-11-15 NOTE — Progress Notes (Signed)
Pt presents today for phlebotomy per MD Mohamed orders & labs from 12/16. Phlebotomy procedure started at 1135 and ended at 1150. 550 grams removed.  Patient stayed for only 5 minutes observation after procedure without any incident.  Patient tolerated procedure well.  VSS.  A&Ox4 and ambulatory w/steady gait.  Given snacks & drinks. 16 G RAC IV needle removed intact. Pt verbalized understanding that he can call with any questions or concerns.

## 2018-11-15 NOTE — Telephone Encounter (Signed)
Printed avs and patient will call to schedule his f/u appointment. Per 12/30 los

## 2018-11-15 NOTE — Patient Instructions (Signed)

## 2018-11-15 NOTE — Progress Notes (Signed)
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  INTERVAL HISTORY: Bradley Riley 62 y.o. male returns to the clinic today for follow-up visit.  The patient is feeling fine today with no concerning complaints.  He underwent few phlebotomies at the [facility] recently the last one was 6 weeks ago.  He denied having any chest pain, shortness of breath, cough or hemoptysis.  He denied having any fever or chills.  He has no nausea, vomiting, diarrhea or constipation.  He was in [place] for several months and the patient is here today for evaluation and repeat blood work.  MEDICAL HISTORY: Past Medical History:  Diagnosis Date  . GERD 07/29/2007   Qualifier: Diagnosis of  By: Garen Grams    . HEPATITIS A, VIRAL, W/O HEPATIC COMA 07/29/2007   Qualifier: Diagnosis of  By: Garen Grams    . History of renal stone 12/29/2013  . Hyperlipidemia   . HYPERTENSION 07/29/2007   Qualifier: Diagnosis of  By: Garen Grams    . HYPERTHYROIDISM 07/29/2007   Qualifier: Diagnosis of  By: Garen Grams    . Other and unspecified hyperlipidemia 07/29/2007   Centricity Description: DYSLIPIDEMIA Qualifier: Diagnosis of  By: Garen Grams   Centricity Description: HYPERLIPIDEMIA Qualifier: Diagnosis of  By: Garen Grams    . Polycythemia 12/29/2013  . UTI (lower urinary tract infection)     ALLERGIES:  has No Known Allergies.  MEDICATIONS:  Current Outpatient Medications  Medication Sig Dispense Refill  . atorvastatin (LIPITOR) 10 MG tablet Take 1 tablet (10 mg total) by mouth daily. 90 tablet 3  . fesoterodine (TOVIAZ) 4 MG TB24 tablet Take 1 tablet (4 mg total) by mouth daily. 90 tablet 3  . losartan (COZAAR) 50 MG  tablet Take 1 tablet (50 mg total) by mouth daily. 90 tablet 3  . zolpidem (AMBIEN) 10 MG tablet Take 1 tablet (10 mg total) by mouth at bedtime as needed for sleep. 30 tablet 2   No current facility-administered medications for this visit.     SURGICAL HISTORY: No past surgical history on file.  REVIEW OF SYSTEMS:  A comprehensive review of systems was negative.   PHYSICAL EXAMINATION: General appearance: alert, cooperative and no distress Head: Normocephalic, without obvious abnormality, atraumatic Neck: no adenopathy, no JVD, supple, symmetrical, trachea midline and thyroid not enlarged, symmetric, no tenderness/mass/nodules Lymph nodes: Cervical, supraclavicular, and axillary nodes normal. Resp: clear to auscultation bilaterally Back: symmetric, no curvature. ROM normal. No CVA tenderness. Cardio: regular rate and rhythm, S1, S2 normal, no murmur, click, rub or gallop GI: soft, non-tender; bowel sounds normal; no masses,  no organomegaly Extremities: extremities normal, atraumatic, no cyanosis or edema  ECOG PERFORMANCE STATUS: 0 - Asymptomatic  Blood pressure (!) 161/107, pulse 86, temperature 98.4 F (36.9 C), temperature source Oral, resp. rate 17, weight 211 lb 14.4 oz (96.1 kg), SpO2 98 %.  LABORATORY DATA: Lab Results  Component Value Date   WBC 7.1 11/15/2018   HGB 16.6 11/15/2018   HCT 50.4 11/15/2018   MCV 91.0 11/15/2018   PLT 155 11/15/2018      Chemistry      Component Value Date/Time   NA 138 10/18/2018 1034   NA 138 07/15/2017 1117  K 4.1 10/18/2018 1034   K 4.0 07/15/2017 1117   CL 104 10/18/2018 1034   CO2 25 10/18/2018 1034   CO2 24 07/15/2017 1117   BUN 20 10/18/2018 1034   BUN 21.8 07/15/2017 1117   CREATININE 1.64 (H) 10/18/2018 1034   CREATININE 1.6 (H) 07/15/2017 1117      Component Value Date/Time   CALCIUM 9.1 10/18/2018 1034   CALCIUM 9.6 07/15/2017 1117   ALKPHOS 55 10/18/2018 1034   ALKPHOS 66 07/15/2017 1117   AST 21 10/18/2018  1034   AST 17 07/15/2017 1117   ALT 19 10/18/2018 1034   ALT 19 07/15/2017 1117   BILITOT 0.7 10/18/2018 1034   BILITOT 0.85 07/15/2017 1117       RADIOGRAPHIC STUDIES: No results found.  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 His hematocrit is elevated today at 50.4%. I recommended for the patient to proceed with phlebotomy today to try to keep his hematocrit around 45%. We will continue to monitor him closely after he comes back from [place] for repeat blood work and phlebotomy if needed. For hypertension, I recommended for the patient to check his blood pressure regularly at home and if it stays elevated to call his primary care physician for adjustment of his medication.   The patient was advised to call immediately if he has any concerning symptoms in the interval. The patient voices understanding of current disease status and treatment options and is in agreement with the current care plan.  All questions were answered. The patient knows to call the clinic with any problems, questions or concerns. We can certainly see the patient much sooner if necessary. I spent 10 minutes counseling the patient face to face. The total time spent in the appointment was 15 minutes.  Disclaimer: This note was dictated with voice recognition software. Similar sounding words can inadvertently be transcribed and may not be corrected upon review.

## 2018-11-16 ENCOUNTER — Telehealth: Payer: Self-pay

## 2018-11-16 NOTE — Telephone Encounter (Signed)
Copied from [place] [phone] [id number]. Topic: Referral - Status >> Nov 16, 2018  9:52 AM Ahmed Prima L wrote: Reason for CRM: Patient is calling to check the status of getting [facility]. Patient has not heard anything. Please Advise >> Nov 16, 2018 10:00 AM [place], Isidoro Donning wrote: Called patient and gave referral info. Patient will contact [facility] to schedule.  >> Nov 16, 2018 10:14 AM Virl Axe D wrote: Pt called back and stated he contacted [facility] for an appt and they told him that the paperwork received from Dr. Jenny Reichmann stated that the issue was not urgent and he could not be scheduled for 3 months. Pt stated that he cannot wait 3 months to be seen. He would like for Dr. Jenny Reichmann to resend referral as an urgent matter or recommend him to somewhere else that can see if sooner. Please advise. CB# [phone]

## 2018-11-16 NOTE — Telephone Encounter (Signed)
Referral sent to [facility] and pt is aware

## 2018-11-16 NOTE — Telephone Encounter (Signed)
Unfortunately, this is not considered an urgent problem, so I cant in good conscience change the order to the urgent, and the best we can do is work on the current referral as is.  I could refer somewhere else such as [facility], [facility] or [facility] but I doubt could be seen sooner, and may not be practical for the long term for him

## 2018-11-16 NOTE — Telephone Encounter (Signed)
Pt would like to see if the referral could be sent to a different office.

## 2019-01-03 DIAGNOSIS — N281 Cyst of kidney, acquired: Secondary | ICD-10-CM | POA: Insufficient documentation

## 2019-10-21 ENCOUNTER — Encounter: Payer: BLUE CROSS/BLUE SHIELD | Admitting: Internal Medicine

## 2020-01-17 ENCOUNTER — Telehealth: Payer: Self-pay | Admitting: Internal Medicine

## 2020-01-17 NOTE — Telephone Encounter (Signed)
    Patient is calling to request a refill on losartan (COZAAR) 50 MG tablet. Patient has not been seen since 2019. Patient declined to schedule because his insurance is out of network.  Patient wants to know if a short suppy can be refilled until he finds a new PCP in network.

## 2020-01-17 NOTE — Telephone Encounter (Signed)
See message below °

## 2020-01-18 MED ORDER — LOSARTAN POTASSIUM 50 MG PO TABS: 50.0000 mg | ORAL_TABLET | Freq: Every day | ORAL | 0 refills | Status: DC

## 2020-01-18 NOTE — Telephone Encounter (Signed)
Done for 1 mo

## 2020-01-18 NOTE — Telephone Encounter (Signed)
Called patient informed him that prescription had been sent pharmacy

## 2020-01-25 DIAGNOSIS — D4101 Neoplasm of uncertain behavior of right kidney: Secondary | ICD-10-CM | POA: Insufficient documentation

## 2020-02-10 ENCOUNTER — Other Ambulatory Visit: Payer: Self-pay | Admitting: Internal Medicine

## 2020-02-10 NOTE — Telephone Encounter (Signed)
Very sorry, unable to refill further due to office refill policy and pt last seen nov 2019

## 2020-02-29 ENCOUNTER — Ambulatory Visit: Payer: BLUE CROSS/BLUE SHIELD | Attending: Internal Medicine

## 2020-02-29 DIAGNOSIS — Z20822 Contact with and (suspected) exposure to covid-19: Secondary | ICD-10-CM

## 2020-03-01 LAB — NOVEL CORONAVIRUS, NAA: SARS-CoV-2, NAA: NOT DETECTED

## 2021-01-04 DIAGNOSIS — N138 Other obstructive and reflux uropathy: Secondary | ICD-10-CM | POA: Diagnosis not present

## 2021-10-29 ENCOUNTER — Telehealth: Payer: Self-pay | Admitting: Internal Medicine

## 2021-10-29 ENCOUNTER — Encounter: Payer: Self-pay | Admitting: Internal Medicine

## 2021-10-29 DIAGNOSIS — U071 COVID-19: Secondary | ICD-10-CM | POA: Diagnosis not present

## 2021-10-29 DIAGNOSIS — R066 Hiccough: Secondary | ICD-10-CM | POA: Diagnosis not present

## 2021-10-29 NOTE — Telephone Encounter (Signed)
Patient came in and is positive for covid wants to know if he is okay to take medication he was prescribed Chlorpromazine. Please give patient a call to let him know if he can take med.

## 2021-10-29 NOTE — Telephone Encounter (Signed)
Yes, that would be fine as that is an older benign safe medication for nausea , that should be quite safe to take as needed

## 2021-10-30 MED ORDER — NIRMATRELVIR/RITONAVIR (PAXLOVID) TABLET (RENAL DOSING): 2.0000 | ORAL_TABLET | Freq: Two times a day (BID) | ORAL | 0 refills | Status: AC

## 2021-10-30 NOTE — Telephone Encounter (Signed)
Ok this was sent to the phamacy at the [facility]     thanks

## 2021-10-30 NOTE — Telephone Encounter (Signed)
Spoke with patient and patient's daughter. They are actually inquiring about whether or not patient can take the COVID antiviral. Patient states that urgent care did not prescribe due to not having an updated kidney level. Please review patient's chart and advise on what to do. If patient is able to take antiviral, they are requesting it to be sent to pharmacy.

## 2021-10-30 NOTE — Telephone Encounter (Signed)
Patient notified

## 2021-11-13 ENCOUNTER — Ambulatory Visit (INDEPENDENT_AMBULATORY_CARE_PROVIDER_SITE_OTHER): Payer: Medicare Other | Admitting: Internal Medicine

## 2021-11-13 ENCOUNTER — Other Ambulatory Visit: Payer: Self-pay

## 2021-11-13 ENCOUNTER — Encounter: Payer: Self-pay | Admitting: Internal Medicine

## 2021-11-13 VITALS — BP 120/78 | HR 100 | Temp 98.6°F | Ht 67.0 in | Wt 205.0 lb

## 2021-11-13 DIAGNOSIS — D751 Secondary polycythemia: Secondary | ICD-10-CM | POA: Diagnosis not present

## 2021-11-13 DIAGNOSIS — N1831 Chronic kidney disease, stage 3a: Secondary | ICD-10-CM

## 2021-11-13 DIAGNOSIS — E559 Vitamin D deficiency, unspecified: Secondary | ICD-10-CM

## 2021-11-13 DIAGNOSIS — E7800 Pure hypercholesterolemia, unspecified: Secondary | ICD-10-CM | POA: Diagnosis not present

## 2021-11-13 DIAGNOSIS — I10 Essential (primary) hypertension: Secondary | ICD-10-CM

## 2021-11-13 DIAGNOSIS — Z1211 Encounter for screening for malignant neoplasm of colon: Secondary | ICD-10-CM

## 2021-11-13 DIAGNOSIS — Z0001 Encounter for general adult medical examination with abnormal findings: Secondary | ICD-10-CM

## 2021-11-13 DIAGNOSIS — Z23 Encounter for immunization: Secondary | ICD-10-CM

## 2021-11-13 DIAGNOSIS — Z1159 Encounter for screening for other viral diseases: Secondary | ICD-10-CM | POA: Diagnosis not present

## 2021-11-13 DIAGNOSIS — R739 Hyperglycemia, unspecified: Secondary | ICD-10-CM | POA: Diagnosis not present

## 2021-11-13 DIAGNOSIS — E538 Deficiency of other specified B group vitamins: Secondary | ICD-10-CM | POA: Diagnosis not present

## 2021-11-13 DIAGNOSIS — U071 COVID-19: Secondary | ICD-10-CM | POA: Diagnosis not present

## 2021-11-13 DIAGNOSIS — D45 Polycythemia vera: Secondary | ICD-10-CM | POA: Diagnosis not present

## 2021-11-13 LAB — BASIC METABOLIC PANEL
BUN: 22 mg/dL (ref 6–23)
CO2: 28 mEq/L (ref 19–32)
Calcium: 9.7 mg/dL (ref 8.4–10.5)
Chloride: 102 mEq/L (ref 96–112)
Creatinine, Ser: 1.82 mg/dL — ABNORMAL HIGH (ref 0.40–1.50)
GFR: 38.28 mL/min — ABNORMAL LOW (ref >60.00)
Glucose, Bld: 97 mg/dL (ref 70–99)
Potassium: 5.2 mEq/L — ABNORMAL HIGH (ref 3.5–5.1)
Sodium: 138 mEq/L (ref 135–145)

## 2021-11-13 LAB — LIPID PANEL
Cholesterol: 172 mg/dL (ref 0–200)
HDL: 33.4 mg/dL — ABNORMAL LOW (ref >39.00)
LDL Cholesterol: 114 mg/dL — ABNORMAL HIGH (ref 0–99)
NonHDL: 138.48
Total CHOL/HDL Ratio: 5
Triglycerides: 122 mg/dL (ref 0.0–149.0)
VLDL: 24.4 mg/dL (ref 0.0–40.0)

## 2021-11-13 LAB — URINALYSIS, ROUTINE W REFLEX MICROSCOPIC
Bilirubin Urine: NEGATIVE
Ketones, ur: NEGATIVE
Leukocytes,Ua: NEGATIVE
Nitrite: NEGATIVE
Specific Gravity, Urine: 1.025 (ref 1.000–1.030)
Urine Glucose: NEGATIVE
Urobilinogen, UA: 0.2 (ref 0.0–1.0)
pH: 6 (ref 5.0–8.0)

## 2021-11-13 LAB — VITAMIN B12: Vitamin B-12: 259 pg/mL (ref 211–911)

## 2021-11-13 LAB — HEPATIC FUNCTION PANEL
ALT: 13 U/L (ref 0–53)
AST: 13 U/L (ref 0–37)
Albumin: 4.2 g/dL (ref 3.5–5.2)
Alkaline Phosphatase: 62 U/L (ref 39–117)
Bilirubin, Direct: 0.1 mg/dL (ref 0.0–0.3)
Total Bilirubin: 0.7 mg/dL (ref 0.2–1.2)
Total Protein: 7.7 g/dL (ref 6.0–8.3)

## 2021-11-13 LAB — CBC WITH DIFFERENTIAL/PLATELET
Basophils Absolute: 0.1 10*3/uL (ref 0.0–0.1)
Basophils Relative: 0.9 % (ref 0.0–3.0)
Eosinophils Absolute: 0.1 10*3/uL (ref 0.0–0.7)
Eosinophils Relative: 1.4 % (ref 0.0–5.0)
HCT: 52 % (ref 39.0–52.0)
Hemoglobin: 17.4 g/dL — ABNORMAL HIGH (ref 13.0–17.0)
Lymphocytes Relative: 14.5 % (ref 12.0–46.0)
Lymphs Abs: 1.1 10*3/uL (ref 0.7–4.0)
MCHC: 33.4 g/dL (ref 30.0–36.0)
MCV: 90.7 fl (ref 78.0–100.0)
Monocytes Absolute: 0.9 10*3/uL (ref 0.1–1.0)
Monocytes Relative: 11 % (ref 3.0–12.0)
Neutro Abs: 5.7 10*3/uL (ref 1.4–7.7)
Neutrophils Relative %: 72.2 % (ref 43.0–77.0)
Platelets: 298 10*3/uL (ref 150.0–400.0)
RBC: 5.73 Mil/uL (ref 4.22–5.81)
RDW: 13.5 % (ref 11.5–15.5)
WBC: 7.8 10*3/uL (ref 4.0–10.5)

## 2021-11-13 LAB — HEMOGLOBIN A1C: Hgb A1c MFr Bld: 5.9 % (ref 4.6–6.5)

## 2021-11-13 LAB — VITAMIN D 25 HYDROXY (VIT D DEFICIENCY, FRACTURES): VITD: 20.27 ng/mL — ABNORMAL LOW (ref 30.00–100.00)

## 2021-11-13 LAB — PSA: PSA: 2.79 ng/mL (ref 0.10–4.00)

## 2021-11-13 LAB — TSH: TSH: 1.73 u[IU]/mL (ref 0.35–5.50)

## 2021-11-13 MED ORDER — AMLODIPINE-OLMESARTAN 10-20 MG PO TABS: 1.0000 | ORAL_TABLET | Freq: Every day | ORAL | 3 refills | Status: DC

## 2021-11-13 MED ORDER — ZOLPIDEM TARTRATE 10 MG PO TABS: 10.0000 mg | ORAL_TABLET | Freq: Every evening | ORAL | 1 refills | Status: AC | PRN

## 2021-11-13 NOTE — Assessment & Plan Note (Signed)
Mild persistent, for f/u with heme /onc referral as per pt request

## 2021-11-13 NOTE — Progress Notes (Signed)
Patient ID: Bradley Riley, male   DOB: [birth year], 65 y.o.   MRN: [id number]         Chief Complaint:: wellness exam and reestablish as new pt with polycythemia vera, recent covid infection, and hld       HPI:  NORBERT MALKIN is a 65 y.o. male here for wellness exam with daughter almost completed her RN study and last testing; due for hep c screen, colonoscopy, declines covid booster, shingrix, flu shot, tdap o/w up to date                        Also s/p covid infection just 2 wks ago, with mild URI symtpoms only, now resolved, and overall feeling ok.  Pt denies chest pain, increased sob or doe, wheezing, orthopnea, PND, increased LE swelling, palpitations, dizziness or syncope.   Pt denies polydipsia, polyuria, or new focal neuro s/s.   Pt denies fever, wt loss, night sweats, loss of appetite, or other constitutional symptoms  Need referral back to f/u with heme/onc.  Trying to follow lower chol diet, does not want statin for now.  No other new complaints   Wt Readings from Last 3 Encounters:  11/13/21 205 lb (93 kg)  11/15/18 212 lb 3.2 oz (96.3 kg)  11/15/18 211 lb 14.4 oz (96.1 kg)   BP Readings from Last 3 Encounters:  11/13/21 120/78  11/15/18 (!) 152/105  11/15/18 (!) 161/107   Immunization History  Administered Date(s) Administered   Hepatitis B 04/26/1996, 05/03/1996, 03/27/1999   Hepatitis B, ped/adol 04/26/1996, 05/03/1996, 03/27/1999   Influenza,inj,Quad PF,6+ Mos 12/29/2013, 01/02/2015   Influenza-Unspecified 10/01/2018, 12/13/2018   PFIZER(Purple Top)SARS-COV-2 Vaccination 02/10/2020, 03/02/2020, 09/29/2020   PNEUMOCOCCAL CONJUGATE-20 11/13/2021   Td 01/12/2001   Health Maintenance Due  Topic Date Due   Hepatitis C Screening  Never done   COLONOSCOPY (Pts 45-49yrs Insurance coverage will need to be confirmed)  05/09/2021      Past Medical History:  Diagnosis Date   GERD 07/29/2007   Qualifier: Diagnosis of  By: Garen Grams     HEPATITIS A, VIRAL, W/O HEPATIC COMA  07/29/2007   Qualifier: Diagnosis of  By: Garen Grams     History of renal stone 12/29/2013   Hyperlipidemia    HYPERTENSION 07/29/2007   Qualifier: Diagnosis of  By: Garen Grams     HYPERTHYROIDISM 07/29/2007   Qualifier: Diagnosis of  By: Garen Grams     Other and unspecified hyperlipidemia 07/29/2007   Centricity Description: DYSLIPIDEMIA Qualifier: Diagnosis of  By: Garen Grams   Centricity Description: HYPERLIPIDEMIA Qualifier: Diagnosis of  By: Garen Grams     Polycythemia 12/29/2013   UTI (lower urinary tract infection)    History reviewed. No pertinent surgical history.  reports that he has never smoked. He uses smokeless tobacco. He reports that he does not drink alcohol and does not use drugs. family history includes Hypertension in his father and mother. No Known Allergies Current Outpatient Medications on File Prior to Visit  Medication Sig Dispense Refill   aspirin 81 MG chewable tablet Chew by mouth.     No current facility-administered medications on file prior to visit.        ROS:  All others reviewed and negative.  Objective        PE:  BP 120/78 (BP Location: Right Arm, Patient Position: Sitting, Cuff Size: Large)    Pulse 100    Temp 98.6 F (37 C) (Oral)  Ht 5\' 7"  (1.702 m)    Wt 205 lb (93 kg)    SpO2 95%    BMI 32.11 kg/m                 Constitutional: Pt appears in NAD               HENT: Head: NCAT.                Right Ear: External ear normal.                 Left Ear: External ear normal.                Eyes: . Pupils are equal, round, and reactive to light. Conjunctivae and EOM are normal               Nose: without d/c or deformity               Neck: Neck supple. Gross normal ROM               Cardiovascular: Normal rate and regular rhythm.                 Pulmonary/Chest: Effort normal and breath sounds without rales or wheezing.                Abd:  Soft, NT, ND, + BS, no organomegaly               Neurological: Pt is  alert. At baseline orientation, motor grossly intact               Skin: Skin is warm. No rashes, no other new lesions, LE edema - \none               Psychiatric: Pt behavior is normal without agitation   Micro: none  Cardiac tracings I have personally interpreted today:  none  Pertinent Radiological findings (summarize): none   Lab Results  Component Value Date   WBC 7.8 11/13/2021   HGB 17.4 (H) 11/13/2021   HCT 52.0 11/13/2021   PLT 298.0 11/13/2021   GLUCOSE 97 11/13/2021   CHOL 172 11/13/2021   TRIG 122.0 11/13/2021   HDL 33.40 (L) 11/13/2021   LDLDIRECT 112.0 07/08/2017   LDLCALC 114 (H) 11/13/2021   ALT 13 11/13/2021   AST 13 11/13/2021   NA 138 11/13/2021   K 5.2 No hemolysis seen (H) 11/13/2021   CL 102 11/13/2021   CREATININE 1.82 (H) 11/13/2021   BUN 22 11/13/2021   CO2 28 11/13/2021   TSH 1.73 11/13/2021   PSA 2.79 11/13/2021   HGBA1C 5.9 11/13/2021   Assessment/Plan:  JACOBY ZANNI is a 65 y.o. White or Caucasian [1] male with  has a past medical history of GERD (07/29/2007), HEPATITIS A, VIRAL, W/O HEPATIC COMA (07/29/2007), History of renal stone (12/29/2013), Hyperlipidemia, HYPERTENSION (07/29/2007), HYPERTHYROIDISM (07/29/2007), Other and unspecified hyperlipidemia (07/29/2007), Polycythemia (12/29/2013), and UTI (lower urinary tract infection).  Hyperlipidemia Uncontrolled - Most recent LDL 151 in Nov 2021 noted on outside labs on this EMR -   Pt hesitates for statin, will check f/u lipids, lower chol diet  Encounter for well adult exam with abnormal findings Age and sex appropriate education and counseling updated with regular exercise and diet Referrals for preventative services - for hep c screen, colonoscopy Immunizations addressed - decliens covid booster, shingrix, flu shot, tdap Smoking counseling  - none needed Evidence for depression or other mood disorder -  none significant Most recent labs reviewed. I have personally reviewed and have noted: 1)  the patient's medical and social history 2) The patient's current medications and supplements 3) The patient's height, weight, and BMI have been recorded in the chart   Essential hypertension BP Readings from Last 3 Encounters:  11/13/21 120/78  11/15/18 (!) 152/105  11/15/18 (!) 161/107   Stable, pt to continue medical treatment azor   CKD (chronic kidney disease), stage III Lab Results  Component Value Date   CREATININE 1.82 (H) 11/13/2021   Stable overall, cont to avoid nephrotoxins   Hyperglycemia Lab Results  Component Value Date   HGBA1C 5.9 11/13/2021   Stable, pt to continue current medical treatment  - diet   Polycythemia Mild persistent, for f/u with heme /onc referral as per pt request  COVID-19 virus infection Very recent, now resolved,  to f/u any worsening symptoms or concerns  Followup: Return in about 6 months (around 05/14/2022).  Cathlean Cower, MD 11/13/2021 8:47 PM [facility]

## 2021-11-13 NOTE — Assessment & Plan Note (Signed)
BP Readings from Last 3 Encounters:  11/13/21 120/78  11/15/18 (!) 152/105  11/15/18 (!) 161/107   Stable, pt to continue medical treatment azor

## 2021-11-13 NOTE — Assessment & Plan Note (Signed)
Lab Results  Component Value Date   CREATININE 1.82 (H) 11/13/2021   Stable overall, cont to avoid nephrotoxins

## 2021-11-13 NOTE — Assessment & Plan Note (Signed)
Age and sex appropriate education and counseling updated with regular exercise and diet Referrals for preventative services - for hep c screen, colonoscopy Immunizations addressed - decliens covid booster, shingrix, flu shot, tdap Smoking counseling  - none needed Evidence for depression or other mood disorder - none significant Most recent labs reviewed. I have personally reviewed and have noted: 1) the patient's medical and social history 2) The patient's current medications and supplements 3) The patient's height, weight, and BMI have been recorded in the chart

## 2021-11-13 NOTE — Assessment & Plan Note (Addendum)
Uncontrolled - Most recent LDL 151 in Nov 2021 noted on outside labs on this EMR -   Pt hesitates for statin, will check f/u lipids, lower chol diet

## 2021-11-13 NOTE — Patient Instructions (Addendum)
Please consider the Novavax covid vaccine when available  You had the Prevnar 20 pneumonia shot today  Please continue all other medications as before, and refills have been done if requested.  Please have the pharmacy call with any other refills you may need.  Please continue your efforts at being more active, low cholesterol diet, and weight control.  You are otherwise up to date with prevention measures today.  Please keep your appointments with your specialists as you may have planned  You will be contacted regarding the referral for: Dr Earlie Server for elevated red blood cells  Please go to the LAB at the blood drawing area for the tests to be done  You will be contacted by phone if any changes need to be made immediately.  Otherwise, you will receive a letter about your results with an explanation, but please check with MyChart first.  Please remember to sign up for MyChart if you have not done so, as this will be important to you in the future with finding out test results, communicating by private email, and scheduling acute appointments online when needed.  Please make an Appointment to return in 6 months, or sooner if needed

## 2021-11-13 NOTE — Assessment & Plan Note (Signed)
Lab Results  Component Value Date   HGBA1C 5.9 11/13/2021   Stable, pt to continue current medical treatment  - diet

## 2021-11-13 NOTE — Assessment & Plan Note (Signed)
Very recent, now resolved,  to f/u any worsening symptoms or concerns

## 2021-11-14 LAB — HEPATITIS C ANTIBODY
Hepatitis C Ab: NONREACTIVE
SIGNAL TO CUT-OFF: 0.12 (ref <1.00)

## 2021-11-15 ENCOUNTER — Telehealth: Payer: Self-pay | Admitting: Internal Medicine

## 2021-11-15 NOTE — Telephone Encounter (Signed)
Scheduled appt per 12/14 referral. Pt was already established with Dr. Julien Nordmann. Pt is aware of appt date and time.

## 2021-11-19 ENCOUNTER — Other Ambulatory Visit: Payer: Self-pay | Admitting: Internal Medicine

## 2021-11-19 DIAGNOSIS — D45 Polycythemia vera: Secondary | ICD-10-CM

## 2021-11-20 ENCOUNTER — Other Ambulatory Visit: Payer: Self-pay

## 2021-11-20 ENCOUNTER — Encounter: Payer: Self-pay | Admitting: Internal Medicine

## 2021-11-20 ENCOUNTER — Inpatient Hospital Stay: Payer: Medicare Other

## 2021-11-20 ENCOUNTER — Inpatient Hospital Stay: Payer: Medicare Other | Attending: Internal Medicine | Admitting: Internal Medicine

## 2021-11-20 VITALS — BP 116/84 | HR 98 | Temp 97.7°F | Resp 18 | Ht 67.0 in | Wt 204.9 lb

## 2021-11-20 DIAGNOSIS — D45 Polycythemia vera: Secondary | ICD-10-CM | POA: Diagnosis not present

## 2021-11-20 DIAGNOSIS — D751 Secondary polycythemia: Secondary | ICD-10-CM | POA: Insufficient documentation

## 2021-11-20 DIAGNOSIS — Z79899 Other long term (current) drug therapy: Secondary | ICD-10-CM | POA: Insufficient documentation

## 2021-11-20 LAB — CBC WITH DIFFERENTIAL (CANCER CENTER ONLY)
Abs Immature Granulocytes: 0.03 10*3/uL (ref 0.00–0.07)
Basophils Absolute: 0.2 10*3/uL — ABNORMAL HIGH (ref 0.0–0.1)
Basophils Relative: 2 %
Eosinophils Absolute: 0.2 10*3/uL (ref 0.0–0.5)
Eosinophils Relative: 2 %
HCT: 51.1 % (ref 39.0–52.0)
Hemoglobin: 17.2 g/dL — ABNORMAL HIGH (ref 13.0–17.0)
Immature Granulocytes: 0 %
Lymphocytes Relative: 20 %
Lymphs Abs: 1.7 10*3/uL (ref 0.7–4.0)
MCH: 30.1 pg (ref 26.0–34.0)
MCHC: 33.7 g/dL (ref 30.0–36.0)
MCV: 89.3 fL (ref 80.0–100.0)
Monocytes Absolute: 0.9 10*3/uL (ref 0.1–1.0)
Monocytes Relative: 10 %
Neutro Abs: 5.5 10*3/uL (ref 1.7–7.7)
Neutrophils Relative %: 66 %
Platelet Count: 259 10*3/uL (ref 150–400)
RBC: 5.72 MIL/uL (ref 4.22–5.81)
RDW: 13.2 % (ref 11.5–15.5)
WBC Count: 8.4 10*3/uL (ref 4.0–10.5)
nRBC: 0 % (ref 0.0–0.2)

## 2021-11-20 LAB — LACTATE DEHYDROGENASE: LDH: 131 U/L (ref 98–192)

## 2021-11-20 LAB — CMP (CANCER CENTER ONLY)
ALT: 12 U/L (ref 0–44)
AST: 15 U/L (ref 15–41)
Albumin: 4.2 g/dL (ref 3.5–5.0)
Alkaline Phosphatase: 62 U/L (ref 38–126)
Anion gap: 8 (ref 5–15)
BUN: 24 mg/dL — ABNORMAL HIGH (ref 8–23)
CO2: 24 mmol/L (ref 22–32)
Calcium: 9.3 mg/dL (ref 8.9–10.3)
Chloride: 105 mmol/L (ref 98–111)
Creatinine: 1.92 mg/dL — ABNORMAL HIGH (ref 0.61–1.24)
GFR, Estimated: 38 mL/min — ABNORMAL LOW (ref >60)
Glucose, Bld: 105 mg/dL — ABNORMAL HIGH (ref 70–99)
Potassium: 5 mmol/L (ref 3.5–5.1)
Sodium: 137 mmol/L (ref 135–145)
Total Bilirubin: 0.7 mg/dL (ref 0.3–1.2)
Total Protein: 7.8 g/dL (ref 6.5–8.1)

## 2021-11-20 NOTE — Progress Notes (Signed)
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  INTERVAL HISTORY: Bradley Riley 65 y.o. male returns to the clinic today for follow-up visit accompanied by his daughter Bradley Riley.  The patient is feeling fine today with no concerning complaints.  He was lost to follow-up in the last 3 years because of change in his insurance and he was seen at [facility] few times before his insurance allowed him to come back to the [facility] system again.  He has been doing phlebotomy at the [facility].  He denied having any current chest pain, shortness of breath, cough or hemoptysis.  He denied having any fever or chills.  He has no nausea, vomiting, diarrhea or constipation.  He has no headache or visual changes.  He is here today for evaluation and repeat blood work.  MEDICAL HISTORY: Past Medical History:  Diagnosis Date   GERD 07/29/2007   Qualifier: Diagnosis of  By: Garen Grams     HEPATITIS A, VIRAL, W/O HEPATIC COMA 07/29/2007   Qualifier: Diagnosis of  By: Garen Grams     History of renal stone 12/29/2013   Hyperlipidemia    HYPERTENSION 07/29/2007   Qualifier: Diagnosis of  By: Garen Grams     HYPERTHYROIDISM 07/29/2007   Qualifier: Diagnosis of  By: Garen Grams     Other and unspecified hyperlipidemia 07/29/2007   Centricity Description: DYSLIPIDEMIA Qualifier: Diagnosis of  By: Garen Grams   Centricity Description: HYPERLIPIDEMIA Qualifier: Diagnosis of  By: Garen Grams     Polycythemia 12/29/2013   UTI (lower urinary tract infection)     ALLERGIES:  has No Known Allergies.  MEDICATIONS:  Current Outpatient Medications  Medication Sig Dispense Refill   amlodipine-olmesartan (AZOR) 10-20 MG  tablet Take 1 tablet by mouth daily. 90 tablet 3   aspirin 81 MG chewable tablet Chew by mouth.     zolpidem (AMBIEN) 10 MG tablet Take 1 tablet (10 mg total) by mouth at bedtime as needed for sleep. 90 tablet 1   No current facility-administered medications for this visit.    SURGICAL HISTORY: History reviewed. No pertinent surgical history.  REVIEW OF SYSTEMS:  A comprehensive review of systems was negative.   PHYSICAL EXAMINATION: General appearance: alert, cooperative and no distress Head: Normocephalic, without obvious abnormality, atraumatic Neck: no adenopathy, no JVD, supple, symmetrical, trachea midline and thyroid not enlarged, symmetric, no tenderness/mass/nodules Lymph nodes: Cervical, supraclavicular, and axillary nodes normal. Resp: clear to auscultation bilaterally Back: symmetric, no curvature. ROM normal. No CVA tenderness. Cardio: regular rate and rhythm, S1, S2 normal, no murmur, click, rub or gallop GI: soft, non-tender; bowel sounds normal; no masses,  no organomegaly Extremities: extremities normal, atraumatic, no cyanosis or edema  ECOG PERFORMANCE STATUS: 0 - Asymptomatic  Blood pressure 116/84, pulse 98, temperature 97.7 F (36.5 C), temperature source Tympanic, resp. rate 18, height 5\' 7"  (1.702 m), weight 204 lb 14.4 oz (92.9 kg), SpO2 98 %.  LABORATORY DATA: Lab Results  Component Value Date   WBC 8.4 11/20/2021   HGB 17.2 (H) 11/20/2021   HCT 51.1 11/20/2021   MCV 89.3 11/20/2021   PLT 259 11/20/2021      Chemistry      Component Value Date/Time   NA  138 11/13/2021 1115   NA 138 07/15/2017 1117   K 5.2 No hemolysis seen (H) 11/13/2021 1115   K 4.0 07/15/2017 1117   CL 102 11/13/2021 1115   CO2 28 11/13/2021 1115   CO2 24 07/15/2017 1117   BUN 22 11/13/2021 1115   BUN 21.8 07/15/2017 1117   CREATININE 1.82 (H) 11/13/2021 1115   CREATININE 1.80 (H) 11/15/2018 0958   CREATININE 1.6 (H) 07/15/2017 1117      Component Value Date/Time    CALCIUM 9.7 11/13/2021 1115   CALCIUM 9.6 07/15/2017 1117   ALKPHOS 62 11/13/2021 1115   ALKPHOS 66 07/15/2017 1117   AST 13 11/13/2021 1115   AST 18 11/15/2018 0958   AST 17 07/15/2017 1117   ALT 13 11/13/2021 1115   ALT 18 11/15/2018 0958   ALT 19 07/15/2017 1117   BILITOT 0.7 11/13/2021 1115   BILITOT 0.5 11/15/2018 0958   BILITOT 0.85 07/15/2017 1117       RADIOGRAPHIC STUDIES: No results found.  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 Our goal is to keep his hematocrit between 45-50%. Repeat CBC today showed hematocrit of 51.1%. I recommended for the patient to proceed with phlebotomy but he would like to keep doing it at the [facility]. I will see him back for follow-up visit in 3 months for evaluation and repeat blood work. The patient was advised to keep his appointment at regular basis for close monitoring of his condition. He was also advised to call immediately if he has any other concerning symptoms in the interval. The patient voices understanding of current disease status and treatment options and is in agreement with the current care plan.  All questions were answered. The patient knows to call the clinic with any problems, questions or concerns. We can certainly see the patient much sooner if necessary.  Disclaimer: This note was dictated with voice recognition software. Similar sounding words can inadvertently be transcribed and may not be corrected upon review.

## 2021-11-20 NOTE — Progress Notes (Signed)
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  INTERVAL HISTORY: Bradley Riley 65 y.o. male returns to the clinic today for follow-up visit.  The patient is feeling fine today with no concerning complaints.  He underwent few phlebotomies at the [facility] recently the last one was 6 weeks ago.  He denied having any chest pain, shortness of breath, cough or hemoptysis.  He denied having any fever or chills.  He has no nausea, vomiting, diarrhea or constipation.  He was in [place] for several months and the patient is here today for evaluation and repeat blood work.  MEDICAL HISTORY: Past Medical History:  Diagnosis Date   GERD 07/29/2007   Qualifier: Diagnosis of  By: Garen Grams     HEPATITIS A, VIRAL, W/O HEPATIC COMA 07/29/2007   Qualifier: Diagnosis of  By: Garen Grams     History of renal stone 12/29/2013   Hyperlipidemia    HYPERTENSION 07/29/2007   Qualifier: Diagnosis of  By: Garen Grams     HYPERTHYROIDISM 07/29/2007   Qualifier: Diagnosis of  By: Garen Grams     Other and unspecified hyperlipidemia 07/29/2007   Centricity Description: DYSLIPIDEMIA Qualifier: Diagnosis of  By: Garen Grams   Centricity Description: HYPERLIPIDEMIA Qualifier: Diagnosis of  By: Garen Grams     Polycythemia 12/29/2013   UTI (lower urinary tract infection)     ALLERGIES:  has No Known Allergies.  MEDICATIONS:  Current Outpatient Medications  Medication Sig Dispense Refill   amlodipine-olmesartan (AZOR) 10-20 MG tablet Take 1 tablet by mouth daily. 90 tablet 3   aspirin 81 MG chewable tablet Chew by mouth.     zolpidem (AMBIEN) 10 MG tablet Take 1 tablet (10 mg total) by mouth at bedtime as needed for  sleep. 90 tablet 1   No current facility-administered medications for this visit.    SURGICAL HISTORY: History reviewed. No pertinent surgical history.  REVIEW OF SYSTEMS:  A comprehensive review of systems was negative.   PHYSICAL EXAMINATION: General appearance: alert, cooperative and no distress Head: Normocephalic, without obvious abnormality, atraumatic Neck: no adenopathy, no JVD, supple, symmetrical, trachea midline and thyroid not enlarged, symmetric, no tenderness/mass/nodules Lymph nodes: Cervical, supraclavicular, and axillary nodes normal. Resp: clear to auscultation bilaterally Back: symmetric, no curvature. ROM normal. No CVA tenderness. Cardio: regular rate and rhythm, S1, S2 normal, no murmur, click, rub or gallop GI: soft, non-tender; bowel sounds normal; no masses,  no organomegaly Extremities: extremities normal, atraumatic, no cyanosis or edema  ECOG PERFORMANCE STATUS: 0 - Asymptomatic  Blood pressure 116/84, pulse 98, temperature 97.7 F (36.5 C), temperature source Tympanic, resp. rate 18, height 5\' 7"  (1.702 m), weight 204 lb 14.4 oz (92.9 kg), SpO2 98 %.  LABORATORY DATA: Lab Results  Component Value Date   WBC 8.4 11/20/2021   HGB 17.2 (H) 11/20/2021   HCT 51.1 11/20/2021   MCV 89.3 11/20/2021   PLT 259 11/20/2021      Chemistry      Component Value Date/Time   NA 138 11/13/2021 1115   NA 138 07/15/2017 1117   K 5.2 No hemolysis seen (H) 11/13/2021 1115   K 4.0 07/15/2017 1117   CL 102 11/13/2021 1115   CO2 28 11/13/2021 1115  CO2 24 07/15/2017 1117   BUN 22 11/13/2021 1115   BUN 21.8 07/15/2017 1117   CREATININE 1.82 (H) 11/13/2021 1115   CREATININE 1.80 (H) 11/15/2018 0958   CREATININE 1.6 (H) 07/15/2017 1117      Component Value Date/Time   CALCIUM 9.7 11/13/2021 1115   CALCIUM 9.6 07/15/2017 1117   ALKPHOS 62 11/13/2021 1115   ALKPHOS 66 07/15/2017 1117   AST 13 11/13/2021 1115   AST 18 11/15/2018 0958   AST 17 07/15/2017 1117    ALT 13 11/13/2021 1115   ALT 18 11/15/2018 0958   ALT 19 07/15/2017 1117   BILITOT 0.7 11/13/2021 1115   BILITOT 0.5 11/15/2018 0958   BILITOT 0.85 07/15/2017 1117       RADIOGRAPHIC STUDIES: No results found.  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 His hematocrit is elevated today at 50.4%. I recommended for the patient to proceed with phlebotomy today to try to keep his hematocrit around 45%. We will continue to monitor him closely after he comes back from [place] for repeat blood work and phlebotomy if needed. For hypertension, I recommended for the patient to check his blood pressure regularly at home and if it stays elevated to call his primary care physician for adjustment of his medication.   The patient was advised to call immediately if he has any concerning symptoms in the interval. The patient voices understanding of current disease status and treatment options and is in agreement with the current care plan.  All questions were answered. The patient knows to call the clinic with any problems, questions or concerns. We can certainly see the patient much sooner if necessary. I spent 10 minutes counseling the patient face to face. The total time spent in the appointment was 15 minutes.  Disclaimer: This note was dictated with voice recognition software. Similar sounding words can inadvertently be transcribed and may not be corrected upon review.

## 2021-11-29 ENCOUNTER — Telehealth: Payer: Self-pay | Admitting: Internal Medicine

## 2021-11-29 NOTE — Telephone Encounter (Signed)
Sch per 12/21 los, pt aware, pt request to  schedule early march due to travel.

## 2021-12-20 ENCOUNTER — Encounter (HOSPITAL_COMMUNITY): Payer: Self-pay

## 2021-12-20 ENCOUNTER — Other Ambulatory Visit: Payer: Self-pay

## 2021-12-20 ENCOUNTER — Emergency Department (HOSPITAL_COMMUNITY)
Admission: EM | Admit: 2021-12-20 | Discharge: 2021-12-21 | Disposition: A | Discharge status: Home / Self Care | Payer: Medicare Other | Attending: Emergency Medicine | Admitting: Emergency Medicine

## 2021-12-20 DIAGNOSIS — I672 Cerebral atherosclerosis: Secondary | ICD-10-CM | POA: Diagnosis not present

## 2021-12-20 DIAGNOSIS — Z8673 Personal history of transient ischemic attack (TIA), and cerebral infarction without residual deficits: Secondary | ICD-10-CM | POA: Diagnosis not present

## 2021-12-20 DIAGNOSIS — I6621 Occlusion and stenosis of right posterior cerebral artery: Secondary | ICD-10-CM | POA: Diagnosis not present

## 2021-12-20 DIAGNOSIS — Z20822 Contact with and (suspected) exposure to covid-19: Secondary | ICD-10-CM | POA: Insufficient documentation

## 2021-12-20 DIAGNOSIS — G319 Degenerative disease of nervous system, unspecified: Secondary | ICD-10-CM | POA: Diagnosis not present

## 2021-12-20 DIAGNOSIS — R9431 Abnormal electrocardiogram [ECG] [EKG]: Secondary | ICD-10-CM | POA: Diagnosis not present

## 2021-12-20 DIAGNOSIS — I10 Essential (primary) hypertension: Secondary | ICD-10-CM | POA: Insufficient documentation

## 2021-12-20 DIAGNOSIS — Z7982 Long term (current) use of aspirin: Secondary | ICD-10-CM | POA: Insufficient documentation

## 2021-12-20 DIAGNOSIS — I639 Cerebral infarction, unspecified: Secondary | ICD-10-CM | POA: Diagnosis not present

## 2021-12-20 DIAGNOSIS — Z79899 Other long term (current) drug therapy: Secondary | ICD-10-CM | POA: Diagnosis not present

## 2021-12-20 DIAGNOSIS — I679 Cerebrovascular disease, unspecified: Secondary | ICD-10-CM | POA: Diagnosis not present

## 2021-12-20 DIAGNOSIS — Z743 Need for continuous supervision: Secondary | ICD-10-CM | POA: Diagnosis not present

## 2021-12-20 DIAGNOSIS — I651 Occlusion and stenosis of basilar artery: Secondary | ICD-10-CM | POA: Diagnosis not present

## 2021-12-20 DIAGNOSIS — R410 Disorientation, unspecified: Secondary | ICD-10-CM | POA: Diagnosis not present

## 2021-12-20 DIAGNOSIS — M47812 Spondylosis without myelopathy or radiculopathy, cervical region: Secondary | ICD-10-CM | POA: Diagnosis not present

## 2021-12-20 DIAGNOSIS — I6381 Other cerebral infarction due to occlusion or stenosis of small artery: Secondary | ICD-10-CM | POA: Diagnosis not present

## 2021-12-20 DIAGNOSIS — R6889 Other general symptoms and signs: Secondary | ICD-10-CM | POA: Diagnosis not present

## 2021-12-20 NOTE — ED Triage Notes (Signed)
Pt BIB [facility] from home for evaluation of an old stroke. Pt had a MRI done by his primary care today to rule out Alzheimer's disease for intermittent confusion and was found to have an old infract, so primary care wanted him evaluated. Pt has no complaints and no neuro deficits.

## 2021-12-20 NOTE — ED Provider Triage Note (Signed)
Emergency Medicine Provider Triage Evaluation Note  Bradley Riley , a 66 y.o. male  was evaluated in triage.  Pt complains of being here for evaluation of stroke seen on MRI  Review of Systems  Positive:  Negative: No weakness  Physical Exam  BP 130/89 (BP Location: Right Arm)    Pulse 98    Temp 98.9 F (37.2 C) (Oral)    Resp 16    Ht 5\' 7"  (1.702 m)    Wt 82.6 kg    SpO2 96%    BMI 28.51 kg/m  Gen:   Awake, no distress   Resp:  Normal effort  MSK:   Moves extremities without difficulty  Other:    Medical Decision Making  Medically screening exam initiated at 5:23 PM.  Appropriate orders placed.  Bradley Riley was informed that the remainder of the evaluation will be completed by another provider, this initial triage assessment does not replace that evaluation, and the importance of remaining in the ED until their evaluation is complete.     Bradley Riley, [place] 12/20/21 1724

## 2021-12-21 ENCOUNTER — Emergency Department (HOSPITAL_COMMUNITY): Payer: Medicare Other

## 2021-12-21 DIAGNOSIS — I672 Cerebral atherosclerosis: Secondary | ICD-10-CM | POA: Diagnosis not present

## 2021-12-21 DIAGNOSIS — I6381 Other cerebral infarction due to occlusion or stenosis of small artery: Secondary | ICD-10-CM | POA: Diagnosis not present

## 2021-12-21 DIAGNOSIS — Z8673 Personal history of transient ischemic attack (TIA), and cerebral infarction without residual deficits: Secondary | ICD-10-CM | POA: Diagnosis not present

## 2021-12-21 DIAGNOSIS — M47812 Spondylosis without myelopathy or radiculopathy, cervical region: Secondary | ICD-10-CM | POA: Diagnosis not present

## 2021-12-21 DIAGNOSIS — I6621 Occlusion and stenosis of right posterior cerebral artery: Secondary | ICD-10-CM | POA: Diagnosis not present

## 2021-12-21 DIAGNOSIS — I651 Occlusion and stenosis of basilar artery: Secondary | ICD-10-CM | POA: Diagnosis not present

## 2021-12-21 LAB — PROTIME-INR
INR: 1.1 (ref 0.8–1.2)
Prothrombin Time: 13.9 seconds (ref 11.4–15.2)

## 2021-12-21 LAB — DIFFERENTIAL
Abs Immature Granulocytes: 0.02 10*3/uL (ref 0.00–0.07)
Basophils Absolute: 0.1 10*3/uL (ref 0.0–0.1)
Basophils Relative: 1 %
Eosinophils Absolute: 0.1 10*3/uL (ref 0.0–0.5)
Eosinophils Relative: 1 %
Immature Granulocytes: 0 %
Lymphocytes Relative: 16 %
Lymphs Abs: 1.2 10*3/uL (ref 0.7–4.0)
Monocytes Absolute: 0.8 10*3/uL (ref 0.1–1.0)
Monocytes Relative: 11 %
Neutro Abs: 5.2 10*3/uL (ref 1.7–7.7)
Neutrophils Relative %: 71 %

## 2021-12-21 LAB — CBC
HCT: 53.5 % — ABNORMAL HIGH (ref 39.0–52.0)
Hemoglobin: 17.4 g/dL — ABNORMAL HIGH (ref 13.0–17.0)
MCH: 30 pg (ref 26.0–34.0)
MCHC: 32.5 g/dL (ref 30.0–36.0)
MCV: 92.2 fL (ref 80.0–100.0)
Platelets: 185 10*3/uL (ref 150–400)
RBC: 5.8 MIL/uL (ref 4.22–5.81)
RDW: 14.1 % (ref 11.5–15.5)
WBC: 7.5 10*3/uL (ref 4.0–10.5)
nRBC: 0 % (ref 0.0–0.2)

## 2021-12-21 LAB — COMPREHENSIVE METABOLIC PANEL
ALT: 14 U/L (ref 0–44)
AST: 39 U/L (ref 15–41)
Albumin: 3.8 g/dL (ref 3.5–5.0)
Alkaline Phosphatase: 52 U/L (ref 38–126)
Anion gap: 8 (ref 5–15)
BUN: 25 mg/dL — ABNORMAL HIGH (ref 8–23)
CO2: 25 mmol/L (ref 22–32)
Calcium: 8.9 mg/dL (ref 8.9–10.3)
Chloride: 103 mmol/L (ref 98–111)
Creatinine, Ser: 1.81 mg/dL — ABNORMAL HIGH (ref 0.61–1.24)
GFR, Estimated: 41 mL/min — ABNORMAL LOW (ref >60)
Glucose, Bld: 156 mg/dL — ABNORMAL HIGH (ref 70–99)
Potassium: 5.1 mmol/L (ref 3.5–5.1)
Sodium: 136 mmol/L (ref 135–145)
Total Bilirubin: 1.4 mg/dL — ABNORMAL HIGH (ref 0.3–1.2)
Total Protein: 6.8 g/dL (ref 6.5–8.1)

## 2021-12-21 LAB — RESP PANEL BY RT-PCR (FLU A&B, COVID) ARPGX2
Influenza A by PCR: NEGATIVE
Influenza B by PCR: NEGATIVE
SARS Coronavirus 2 by RT PCR: NEGATIVE

## 2021-12-21 LAB — APTT: aPTT: 28 seconds (ref 24–36)

## 2021-12-21 LAB — ETHANOL: Alcohol, Ethyl (B): <10 mg/dL (ref <10)

## 2021-12-21 MED ORDER — IOHEXOL 350 MG/ML SOLN
75.0000 mL | Freq: Once | INTRAVENOUS | Status: AC | PRN
  Administered 2021-12-21: 75 mL via INTRAVENOUS

## 2021-12-21 NOTE — Progress Notes (Addendum)
Called regarding outpatient MRI results. He has no acute symptoms. He has chronic ischemic areas on MRI, unfortunately, I do not have access to these images, but from the description it sounds like small vessel ischemia. Also small micro hemorrhages. I do not typically recommend inpatient admission for workup for strokes that are over two weeks old. He could likely benefit from antiplatelet therapy, LDL goal < 70,  DM assessment with goal A1C < 7. Could consider vascular imaging. He can follow up with outpatient neurology.   Approximately 10 minutes were spent of which 5 were spent in direct communication with the provider.   Roland Rack, MD [facility] [phone]  If 7pm- 7am, please page neurology on call as listed in [place].

## 2021-12-21 NOTE — Discharge Instructions (Signed)

## 2021-12-21 NOTE — ED Provider Notes (Signed)
[facility] EMERGENCY DEPARTMENT Provider Note   CSN: [id number] Arrival date & time: 12/20/21  1635     History  Chief Complaint  Patient presents with   Cerebrovascular Accident    Bradley Riley is a 66 y.o. male.  The history is provided by the patient, the spouse, a relative and a caregiver.  Cerebrovascular Accident This is a new problem. The current episode started more than 1 week ago. The problem occurs constantly. The problem has not changed since onset.Pertinent negatives include no chest pain, no abdominal pain, no headaches and no shortness of breath. Nothing aggravates the symptoms. Nothing relieves the symptoms.  Patient with history of hypertension presents with possible stroke.  Patient reports that he had an MRI done by his primary care physician.  This apparently was ordered to rule out Alzheimer's disease.  MRI found evidence of recent stroke.  The MRI reports revealed previous infarction in the right corona radiata, right thalamus, pons and bilateral cerebellum.  There was no acute infarct noted. PCP requested the patient go to the ER  Patient has no new complaints.  Denies any speech difficulty.  No arm or leg weakness.    Home Medications Prior to Admission medications   Medication Sig Start Date End Date Taking? Authorizing Provider  amlodipine-olmesartan (AZOR) 10-20 MG tablet Take 1 tablet by mouth daily. 11/13/21  Yes Biagio Borg, MD  aspirin 81 MG chewable tablet Chew by mouth.   Yes [provider]  Multiple Vitamins-Minerals (ONE-A-DAY MENS 50+) TABS Take 1 tablet by mouth daily.   Yes [provider]  zolpidem (AMBIEN) 10 MG tablet Take 1 tablet (10 mg total) by mouth at bedtime as needed for sleep. Patient not taking: Reported on 12/21/2021 11/13/21 12/13/21  Biagio Borg, MD      Allergies    Patient has no known allergies.    Review of Systems   Review of Systems  Constitutional:  Negative for fever.  Eyes:   Negative for visual disturbance.  Respiratory:  Negative for shortness of breath.   Cardiovascular:  Negative for chest pain.  Gastrointestinal:  Negative for abdominal pain.  Neurological:  Negative for speech difficulty, weakness and headaches.  All other systems reviewed and are negative.  Physical Exam Updated Vital Signs BP 129/68    Pulse 78    Temp 98.1 F (36.7 C)    Resp (!) 21    Ht 1.702 m (5\' 7" )    Wt 82.6 kg    SpO2 93%    BMI 28.51 kg/m  Physical Exam CONSTITUTIONAL: Elderly, no acute HEAD: Normocephalic/atraumatic EYES: EOMI/PERRL, no nystagmus, no ptosis ENMT: Mucous membranes moist NECK: supple no meningeal signs, no bruits CV: S1/S2 noted, no murmurs/rubs/gallops noted LUNGS: Lungs are clear to auscultation bilaterally, no apparent distress ABDOMEN: soft, nontender, no rebound or guarding GU:no cva tenderness NEURO:Awake/alert, face symmetric, no arm or leg drift is noted Equal 5/5 strength with shoulder abduction, elbow flex/extension, wrist flex/extension in upper extremities and equal hand grips bilaterally Equal 5/5 strength with hip flexion,knee flex/extension, foot dorsi/plantar flexion Cranial nerves 3/4/5/6/06/08/09/11/12 tested and intact Sensation to light touch intact in all extremities EXTREMITIES: pulses normal, full ROM SKIN: warm, color normal PSYCH: no abnormalities of mood noted  ED Results / Procedures / Treatments   Labs (all labs ordered are listed, but only abnormal results are displayed) Labs Reviewed  CBC - Abnormal; Notable for the following components:      Result Value  Hemoglobin 17.4 (*)    HCT 53.5 (*)    All other components within normal limits  COMPREHENSIVE METABOLIC PANEL - Abnormal; Notable for the following components:   Glucose, Bld 156 (*)    BUN 25 (*)    Creatinine, Ser 1.81 (*)    Total Bilirubin 1.4 (*)    GFR, Estimated 41 (*)    All other components within normal limits  RESP PANEL BY RT-PCR (FLU A&B, COVID)  ARPGX2  ETHANOL  PROTIME-INR  APTT  DIFFERENTIAL    EKG EKG Interpretation  Date/Time:  Friday December 20 2021 16:44:30 EST Ventricular Rate:  98 PR Interval:  132 QRS Duration: 88 QT Interval:  340 QTC Calculation: 434 R Axis:   -15 Text Interpretation: Normal sinus rhythm Possible Anterior infarct , age undetermined Abnormal ECG Confirmed by Ripley Fraise [id number]) on 12/20/2021 11:38:57 PM  Radiology CT ANGIO HEAD NECK W WO CM  Result Date: 12/21/2021 CLINICAL DATA:  Initial evaluation for stroke. EXAM: CT ANGIOGRAPHY HEAD AND NECK TECHNIQUE: Multidetector CT imaging of the head and neck was performed using the standard protocol during bolus administration of intravenous contrast. Multiplanar CT image reconstructions and MIPs were obtained to evaluate the vascular anatomy. Carotid stenosis measurements (when applicable) are obtained utilizing NASCET criteria, using the distal internal carotid diameter as the denominator. RADIATION DOSE REDUCTION: This exam was performed according to the departmental dose-optimization program which includes automated exposure control, adjustment of the mA and/or kV according to patient size and/or use of iterative reconstruction technique. CONTRAST:  32mL OMNIPAQUE IOHEXOL 350 MG/ML SOLN COMPARISON:  None. FINDINGS: CT HEAD FINDINGS Brain: Mildly advanced cerebral atrophy for age. Moderate to advanced chronic microvascular ischemic disease. 7 mm age indeterminate lacunar infarct at the right thalamus. Additional small chronic lacunar infarct at the central pons. No acute intracranial hemorrhage. No acute large vessel territory infarct. No mass lesion or midline shift. No hydrocephalus or extra-axial fluid collection. Vascular: No hyperdense vessel. Calcified atherosclerosis present at skull base. Skull: Scalp soft tissues and calvarium within normal limits. Sinuses: Clear. Orbits: Unremarkable. Review of the MIP images confirms the above findings CTA NECK  FINDINGS Aortic arch: Visualized aortic arch normal caliber with normal 3 vessel morphology. No stenosis about the origin of the great vessels. Right carotid system: Right common and internal carotid arteries widely patent without stenosis, dissection or occlusion. Left carotid system: Left common and internal carotid arteries widely patent without stenosis, dissection or occlusion. Vertebral arteries: Both vertebral arteries arise from the subclavian arteries. No proximal subclavian artery stenosis. Mild soft plaque at the origin of the left vertebral artery with no more than mild stenosis. Vertebral arteries otherwise patent without stenosis or dissection. Skeleton: No discrete or worrisome osseous lesions. Moderate spondylosis present at C6-7. Other neck: No other acute soft tissue abnormality within the neck. Upper chest: Visualized upper chest demonstrates no acute finding. Review of the MIP images confirms the above findings CTA HEAD FINDINGS Anterior circulation: Petrous segments patent bilaterally. Atheromatous change within the carotid siphons bilaterally, slightly worse on the left. A1 segments patent bilaterally. Normal anterior communicating artery complex. Both ACAs widely patent proximally. Focal moderate distal left 8 3 stenosis noted (series 13, image 14). No M1 stenosis or occlusion. Normal right MCA bifurcation. Left MCA trifurcations. Distal MCA branches perfused and symmetric. Posterior circulation: Both V4 segments irregular but widely patent. Both PICA patent. Basilar widely patent proximally. Focal moderate stenosis involving the distal basilar artery at the level of the superior cerebral arteries (series  11, image 106). Both SCA is patent bilaterally. Both PCAs primarily supplied via the basilar. Moderate proximal right P1/P2 stenosis (series 14, image 23). PCAs irregular but otherwise patent to their distal aspects. Venous sinuses: Grossly patent allowing for timing the contrast bolus.  Anatomic variants: None significant.  No aneurysm. Review of the MIP images confirms the above findings IMPRESSION: CT HEAD IMPRESSION: 1. 7 mm lacunar infarct at the right thalamus, somewhat age indeterminate, and could be chronic in nature. Correlation with dedicated MRI could be performed for further evaluation if there is clinical concern for acute ischemia. 2. Underlying atrophy with moderate chronic microvascular ischemic disease. CTA HEAD AND NECK IMPRESSION: 1. Negative CTA for large vessel occlusion. 2. 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 3. Wide patency of the major arterial vasculature of the neck. Electronically Signed   By: Jeannine Boga M.D.   On: 12/21/2021 04:28    Procedures Procedures    Medications Ordered in ED Medications  iohexol (OMNIPAQUE) 350 MG/ML injection 75 mL (75 mLs Intravenous Contrast Given 12/21/21 0313)    ED Course/ Medical Decision Making/ A&P Clinical Course as of 12/21/21 0708  Sat Dec 21, 2021  0054 D/w neurology Dr Leonel Ramsay for evaluation [DW]  [phone] D/w Dr Leonel Ramsay.  He has reviewed imaging.  No acute findings.  Recommends CT angio head/neck, if negative he can f/u with neurology [DW]    Clinical Course User Index [DW] Ripley Fraise, MD                           Medical Decision Making Amount and/or Complexity of Data Reviewed Labs: ordered. Radiology: ordered.  Risk Prescription drug management.   This patient presents to the ED for concern of CVA, this involves an extensive number of treatment options, and is a complaint that carries with it a high risk of complications and morbidity.   Comorbidities that complicate the patient evaluation: Patients presentation is complicated by their history of cognitive impairment    Additional history obtained: Additional history obtained from family Records reviewed Care Everywhere/External Records  Lab Tests: I  Ordered, and personally interpreted labs.  The pertinent results include: Mild renal insufficiency  Imaging Studies ordered: I ordered imaging studies including CT scan head and neck I independently visualized and interpreted imaging which showed previous lacunar infarct I agree with the radiologist interpretation  Cardiac Monitoring: The patient was maintained on a cardiac monitor.  I personally viewed and interpreted the cardiac monitor which showed an underlying rhythm of:  sinus rhythm   Consultations Obtained: I requested consultation with the consultant neurology, and discussed  findings as well as pertinent plan - they recommend: Recommend CT angio, follow-up if no acute findings  Reevaluation: After the interventions noted above, I reevaluated the patient and found that they have :improved  Complexity of problems addressed: Patients presentation is most consistent with  acute complicated illness/injury requiring diagnostic workup      Disposition: After consideration of the diagnostic results and the patients response to treatment,  I feel that the patent would benefit from discharge .   Patient found to have old infarcts on MRI and sent by PCP.  He had no neurodeficits in the ER he was ambulated.  Consideration for admission, but since patient has no neurodeficits, he is safe for outpatient management and follow-up with neurology.  Neurology referral has been placed.  Consulted Dr. Leonel Ramsay with neurology  we discussed CT angio findings.  Patient can continue aspirin at home.         Final Clinical Impression(s) / ED Diagnoses Final diagnoses:  Cerebrovascular disease  Cerebral infarction, chronic    Rx / DC Orders ED Discharge Orders          Ordered    Ambulatory referral to Neurology       Comments: An appointment is requested in approximately: 2 weeks   12/21/21 0059              Ripley Fraise, MD 12/21/21 0710

## 2021-12-21 NOTE — ED Notes (Signed)
Patient verbalizes understanding of d/c instructions. Opportunities for questions and answers were provided. Pt d/c from ED and ambulated to lobby with family.  

## 2021-12-21 NOTE — ED Notes (Signed)
Patient transported to CT 

## 2021-12-23 ENCOUNTER — Encounter: Payer: Self-pay | Admitting: Internal Medicine

## 2021-12-24 ENCOUNTER — Encounter: Payer: Self-pay | Admitting: Internal Medicine

## 2021-12-25 ENCOUNTER — Encounter: Payer: Self-pay | Admitting: Neurology

## 2021-12-25 ENCOUNTER — Other Ambulatory Visit: Payer: Self-pay

## 2021-12-25 ENCOUNTER — Ambulatory Visit (INDEPENDENT_AMBULATORY_CARE_PROVIDER_SITE_OTHER): Payer: Medicare Other | Admitting: Neurology

## 2021-12-25 VITALS — BP 126/87 | Ht 67.0 in | Wt 204.0 lb

## 2021-12-25 DIAGNOSIS — R799 Abnormal finding of blood chemistry, unspecified: Secondary | ICD-10-CM | POA: Diagnosis not present

## 2021-12-25 DIAGNOSIS — E7849 Other hyperlipidemia: Secondary | ICD-10-CM

## 2021-12-25 DIAGNOSIS — G3184 Mild cognitive impairment, so stated: Secondary | ICD-10-CM

## 2021-12-25 DIAGNOSIS — E538 Deficiency of other specified B group vitamins: Secondary | ICD-10-CM | POA: Diagnosis not present

## 2021-12-25 DIAGNOSIS — E079 Disorder of thyroid, unspecified: Secondary | ICD-10-CM | POA: Diagnosis not present

## 2021-12-25 DIAGNOSIS — R7989 Other specified abnormal findings of blood chemistry: Secondary | ICD-10-CM | POA: Diagnosis not present

## 2021-12-25 DIAGNOSIS — R930 Abnormal findings on diagnostic imaging of skull and head, not elsewhere classified: Secondary | ICD-10-CM

## 2021-12-25 MED ORDER — CLOPIDOGREL BISULFATE 75 MG PO TABS: 75.0000 mg | ORAL_TABLET | Freq: Every day | ORAL | 11 refills | Status: DC

## 2021-12-25 MED ORDER — ATORVASTATIN CALCIUM 40 MG PO TABS
40.0000 mg | ORAL_TABLET | Freq: Every day | ORAL | 2 refills | Status: DC
Start: 2021-12-25 — End: 2022-03-25

## 2021-12-25 MED ORDER — ASPIRIN EC 81 MG PO TBEC: 81.0000 mg | DELAYED_RELEASE_TABLET | Freq: Every day | ORAL | 11 refills | Status: DC

## 2021-12-25 NOTE — Patient Instructions (Signed)
I had a long discussion with the patient and her daughter regarding his abnormal MRI scan of the brain showing multiple lacunar infarcts which apparently are clinically silent as well as findings of CT angiogram showing diffuse intracranial stenosis and also discussed his mild memory loss and cognitive impairment and family history of Alzheimer's which puts him at risk for the same in the future.  I recommend with take aspirin and Plavix for 3 months followed by aspirin alone and maintain aggressive risk factor modification with strict control of hypertension with blood pressure goal below 130/90, lipids with LDL cholesterol goal below 70 mg percent and diabetes with hemoglobin A1c goal below 6.5%.  He was also encouraged to eat a healthy diet with lots of fruits, vegetables, cereals, whole grains and also to exercise regularly and lose weight.  I also recommend we check dementia panel labs and EEG.  We also discussed memory compensation strategies and encouraged him to increase participation in regular activities which are cognitively challenging like solving crossword puzzles, playing bridge and sodoku.  He will return for follow-up in the future in 4 months or call earlier if necessary. Memory Compensation Strategies  Use "WARM" strategy.  W= write it down  A= associate it  R= repeat it  M= make a mental note  2.   You can keep a Social worker.  Use a 3-ring notebook with sections for the following: calendar, important names and phone numbers,  medications, doctors' names/phone numbers, lists/reminders, and a section to journal what you did  each day.   3.    Use a calendar to write appointments down.  4.    Write yourself a schedule for the day.  This can be placed on the calendar or in a separate section of the Memory Notebook.  Keeping a  regular schedule can help memory.  5.    Use medication organizer with sections for each day or morning/evening pills.  You may need help loading it  6.     Keep a basket, or pegboard by the door.  Place items that you need to take out with you in the basket or on the pegboard.  You may also want to  include a message board for reminders.  7.    Use sticky notes.  Place sticky notes with reminders in a place where the task is performed.  For example: " turn off the  stove" placed by the stove, "lock the door" placed on the door at eye level, " take your medications" on  the bathroom mirror or by the place where you normally take your medications.  8.    Use alarms/timers.  Use while cooking to remind yourself to check on food or as a reminder to take your medicine, or as a  reminder to make a call, or as a reminder to perform another task, etc. Stroke Prevention Some medical conditions and behaviors can lead to a higher chance of having a stroke. You can help prevent a stroke by eating healthy, exercising, not smoking, and managing any medical conditions you have. Stroke is a leading cause of functional impairment. Primary prevention is particularly important because a majority of strokes are first-time events. Stroke changes the lives of not only those who experience a stroke but also their family and other caregivers. How can this condition affect me? A stroke is a medical emergency and should be treated right away. A stroke can lead to brain damage and can sometimes be life-threatening. If a  person gets medical treatment right away, there is a better chance of surviving and recovering from a stroke. What can increase my risk? The following medical conditions may increase your risk of a stroke: Cardiovascular disease. High blood pressure (hypertension). Diabetes. High cholesterol. Sickle cell disease. Blood clotting disorders (hypercoagulable state). Obesity. Sleep disorders (obstructive sleep apnea). Other risk factors include: Being older than age 23. Having a history of blood clots, stroke, or mini-stroke (transient ischemic attack,  TIA). Genetic factors, such as race, ethnicity, or a family history of stroke. Smoking cigarettes or using other tobacco products. Taking birth control pills, especially if you also use tobacco. Heavy use of alcohol or drugs, especially cocaine and methamphetamine. Physical inactivity. What actions can I take to prevent this? Manage your health conditions High cholesterol levels. Eating a healthy diet is important for preventing high cholesterol. If cholesterol cannot be managed through diet alone, you may need to take medicines. Take any prescribed medicines to control your cholesterol as told by your health care provider. Hypertension. To reduce your risk of stroke, try to keep your blood pressure below 130/80. Eating a healthy diet and exercising regularly are important for controlling blood pressure. If these steps are not enough to manage your blood pressure, you may need to take medicines. Take any prescribed medicines to control hypertension as told by your health care provider. Ask your health care provider if you should monitor your blood pressure at home. Have your blood pressure checked every year, even if your blood pressure is normal. Blood pressure increases with age and some medical conditions. Diabetes. Eating a healthy diet and exercising regularly are important parts of managing your blood sugar (glucose). If your blood sugar cannot be managed through diet and exercise, you may need to take medicines. Take any prescribed medicines to control your diabetes as told by your health care provider. Get evaluated for obstructive sleep apnea. Talk to your health care provider about getting a sleep evaluation if you snore a lot or have excessive sleepiness. Make sure that any other medical conditions you have, such as atrial fibrillation or atherosclerosis, are managed. Nutrition Follow instructions from your health care provider about what to eat or drink to help manage your health  condition. These instructions may include: Reducing your daily calorie intake. Limiting how much salt (sodium) you use to 1,500 milligrams (mg) each day. Using only healthy fats for cooking, such as olive oil, canola oil, or sunflower oil. Eating healthy foods. You can do this by: Choosing foods that are high in fiber, such as whole grains, and fresh fruits and vegetables. Eating at least 5 servings of fruits and vegetables a day. Try to fill one-half of your plate with fruits and vegetables at each meal. Choosing lean protein foods, such as lean cuts of meat, poultry without skin, fish, tofu, beans, and nuts. Eating low-fat dairy products. Avoiding foods that are high in sodium. This can help lower blood pressure. Avoiding foods that have saturated fat, trans fat, and cholesterol. This can help prevent high cholesterol. Avoiding processed and prepared foods. Counting your daily carbohydrate intake.  Lifestyle If you drink alcohol: Limit how much you have to: 0-1 drink a day for women who are not pregnant. 0-2 drinks a day for men. Know how much alcohol is in your drink. In the [place] one drink equals one 12 oz bottle of beer (374mL), one 5 oz glass of wine (121mL), or one 1 oz glass of hard liquor (58mL). Do not use any  products that contain nicotine or tobacco. These products include cigarettes, chewing tobacco, and vaping devices, such as e-cigarettes. If you need help quitting, ask your health care provider. Avoid secondhand smoke. Do not use drugs. Activity  Try to stay at a healthy weight. Get at least 30 minutes of exercise on most days, such as: Fast walking. Biking. Swimming. Medicines Take over-the-counter and prescription medicines only as told by your health care provider. Aspirin or blood thinners (antiplatelets or anticoagulants) may be recommended to reduce your risk of forming blood clots that can lead to stroke. Avoid taking birth control pills. Talk to your health  care provider about the risks of taking birth control pills if: You are over 11 years old. You smoke. You get very bad headaches. You have had a blood clot. Where to find more information American Stroke Association: www.strokeassociation.org Get help right away if: You or a loved one has any symptoms of a stroke. "BE FAST" is an easy way to remember the main warning signs of a stroke: B - Balance. Signs are dizziness, sudden trouble walking, or loss of balance. E - Eyes. Signs are trouble seeing or a sudden change in vision. F - Face. Signs are sudden weakness or numbness of the face, or the face or eyelid drooping on one side. A - Arms. Signs are weakness or numbness in an arm. This happens suddenly and usually on one side of the body. S - Speech. Signs are sudden trouble speaking, slurred speech, or trouble understanding what people say. T - Time. Time to call emergency services. Write down what time symptoms started. You or a loved one has other signs of a stroke, such as: A sudden, severe headache with no known cause. Nausea or vomiting. Seizure. These symptoms may represent a serious problem that is an emergency. Do not wait to see if the symptoms will go away. Get medical help right away. Call your local emergency services (911 in the [place] Do not drive yourself to the hospital. Summary You can help to prevent a stroke by eating healthy, exercising, not smoking, limiting alcohol intake, and managing any medical conditions you may have. Do not use any products that contain nicotine or tobacco. These include cigarettes, chewing tobacco, and vaping devices, such as e-cigarettes. If you need help quitting, ask your health care provider. Remember "BE FAST" for warning signs of a stroke. Get help right away if you or a loved one has any of these signs. This information is not intended to replace advice given to you by your health care provider. Make sure you discuss any questions you have  with your health care provider. Document Revised: 06/18/2020 Document Reviewed: 06/18/2020 Elsevier Patient Education  [place].

## 2021-12-25 NOTE — Progress Notes (Signed)
[facility] [address] [place]. [place] [address] [phone]       OFFICE CONSULT NOTE  Mr. SALLY REIMERS Date of Birth:  11-12-1956 Medical Record Number:  [id number]   Referring MD: Loni Dolly  Reason for Referral: Abnormal MRI  HPI: Mr.Borgwardt is a 66 year old pleasant [place] male seen today for initial office consultation visit.  Is accompanied by his daughter.  Patient states he had his MRI scheduled by his primary care physician because of his concern for Alzheimer's given his strong family history and his younger sister recently being diagnosed with Alzheimer's.  He himself denies any symptoms of memory difficulties, cognitive impairment, trouble handling his affairs.  He is retired and is totally independent in actives of daily living.  He lives with his daughter and does not need any help.  He denies any symptoms suggestive of stroke or TIA however his MRI scan done on 12/20/2021 at [facility] showed multiple small old right coronary radiata, right thalamus, pons and bilateral cerebellum lacunar infarcts along with changes of small vessel disease.  He also had CT angiogram of the brain and neck done on 12/21/2021 which showed moderate distal basilar and right-sided P1-P2 junction and distal left A3 stenosis.  Lab work on 11/13/2021 showed hemoglobin A1c 5.9 and LDL cholesterol 114 mg percent.  Patient did quite well Mini-Mental status testing by my CMA today and scored 30/30 but by my testing he had diminished recall 0/3.  He was able to name 14 animals which walk on 4 legs and clock drawing was 3/4.  He denies any prior known neurological problems including stroke, TIA, seizures or migraines  ROS:   14 system review of systems is positive for no complaints today and all other systems negative  PMH:  Past Medical History:  Diagnosis Date   GERD 07/29/2007   Qualifier: Diagnosis of  By: Garen Grams     HEPATITIS A, VIRAL, W/O HEPATIC COMA 07/29/2007    Qualifier: Diagnosis of  By: Garen Grams     History of renal stone 12/29/2013   Hyperlipidemia    HYPERTENSION 07/29/2007   Qualifier: Diagnosis of  By: Garen Grams     HYPERTHYROIDISM 07/29/2007   Qualifier: Diagnosis of  By: Garen Grams     Other and unspecified hyperlipidemia 07/29/2007   Centricity Description: DYSLIPIDEMIA Qualifier: Diagnosis of  By: Garen Grams   Centricity Description: HYPERLIPIDEMIA Qualifier: Diagnosis of  By: Garen Grams     Polycythemia 12/29/2013   UTI (lower urinary tract infection)     Social History:  Social History   Socioeconomic History   Marital status: Married    Spouse name: Not on file   Number of children: Not on file   Years of education: masters   Highest education level: Not on file  Occupational History   Occupation: Self-Employed  Tobacco Use   Smoking status: Never   Smokeless tobacco: Current  Substance and Sexual Activity   Alcohol use: No   Drug use: No   Sexual activity: Not on file  Other Topics Concern   Not on file  Social History Narrative   Not on file   Social Determinants of Health   Financial Resource Strain: Not on file  Food Insecurity: Not on file  Transportation Needs: Not on file  Physical Activity: Not on file  Stress: Not on file  Social Connections: Not on file  Intimate Partner Violence: Not on file    Medications:   Current Outpatient  Medications on File Prior to Visit  Medication Sig Dispense Refill   amlodipine-olmesartan (AZOR) 10-20 MG tablet Take 1 tablet by mouth daily. 90 tablet 3   Multiple Vitamins-Minerals (ONE-A-DAY MENS 50+) TABS Take 1 tablet by mouth daily.     zolpidem (AMBIEN) 10 MG tablet Take 1 tablet (10 mg total) by mouth at bedtime as needed for sleep. (Patient not taking: Reported on 12/21/2021) 90 tablet 1   No current facility-administered medications on file prior to visit.    Allergies:  No Known Allergies  Physical Exam General: well  developed, well nourished middle-aged [place] male, seated, in no evident distress Head: head normocephalic and atraumatic.   Neck: supple with no carotid or supraclavicular bruits Cardiovascular: regular rate and rhythm, no murmurs Musculoskeletal: no deformity Skin:  no rash/petichiae Vascular:  Normal pulses all extremities  Neurologic Exam Mental Status: Awake and fully alert. Oriented to place and time. Recent and remote memory intact. Attention span, concentration and fund of knowledge appropriate. Mood and affect appropriate.  Diminished recall 0/3 for me but did much better with the CMA testing and he scored 30/30 on the Mini-Mental.  Clock drawing 3/4.  Able to name 12 animals which can walk on 4 legs.  Geriatric depression scale score 2 not depressed. Cranial Nerves: Fundoscopic exam reveals sharp disc margins. Pupils equal, briskly reactive to light. Extraocular movements full without nystagmus. Visual fields full to confrontation. Hearing intact. Facial sensation intact. Face, tongue, palate moves normally and symmetrically.  Motor: Normal bulk and tone. Normal strength in all tested extremity muscles. Sensory.: intact to touch , pinprick , position and vibratory sensation.  Coordination: Rapid alternating movements normal in all extremities. Finger-to-nose and heel-to-shin performed accurately bilaterally. Gait and Station: Arises from chair without difficulty. Stance is normal. Gait demonstrates normal stride length and balance . Able to heel, toe and tandem walk without difficulty.  Reflexes: 1+ and symmetric. Toes downgoing.   NIHSS  0 Modified Rankin  0   ASSESSMENT: 66 year old [place] male with strong family history of Alzheimer's with no obvious clinical symptoms but examination suggest mild cognitive impairment which is likely vascular given abnormal MRI showing multiple lacunar strokes from which he is apparently not had any clinical or stroke symptoms.  Vascular risk  factors of hyperlipidemia and obesity and intracranial stenosis     PLAN:I had a long discussion with the patient and her daughter regarding his abnormal MRI scan of the brain showing multiple lacunar infarcts which apparently are clinically silent as well as findings of CT angiogram showing diffuse intracranial stenosis and also discussed his mild memory loss and cognitive impairment and family history of Alzheimer's which puts him at risk for the same in the future.  I recommend with take aspirin and Plavix for 3 months followed by aspirin alone and maintain aggressive risk factor modification with strict control of hypertension with blood pressure goal below 130/90, lipids with LDL cholesterol goal below 70 mg percent and diabetes with hemoglobin A1c goal below 6.5%.  He was also encouraged to eat a healthy diet with lots of fruits, vegetables, cereals, whole grains and also to exercise regularly and lose weight.  I also recommend we check dementia panel labs and EEG.  We also discussed memory compensation strategies and encouraged him to increase participation in regular activities which are cognitively challenging like solving crossword puzzles, playing bridge and sodoku.  He will return for follow-up in the future in 4 months or call earlier if necessary.  Greater than  50% time during this prolonged 60-minute consultation visit was spent in counseling and coordination of care about his abnormal MRI scan of the brain, intracranial stenosis, silent lacunar strokes and mild cognitive impairment and risk for Alzheimer's and answering questions. Antony Contras, MD  Note: This document was prepared with digital dictation and possible smart phrase technology. Any transcriptional errors that result from this process are unintentional.

## 2021-12-26 ENCOUNTER — Encounter: Payer: Self-pay | Admitting: Neurology

## 2021-12-26 LAB — DEMENTIA PANEL
Homocysteine: 23.9 umol/L — ABNORMAL HIGH (ref 0.0–17.2)
RPR Ser Ql: NONREACTIVE
TSH: 2.31 u[IU]/mL (ref 0.450–4.500)
Vitamin B-12: 397 pg/mL (ref 232–1245)

## 2022-01-01 ENCOUNTER — Other Ambulatory Visit: Payer: Self-pay | Admitting: Neurology

## 2022-01-01 MED ORDER — FOLIC ACID 1 MG PO TABS: 1.0000 mg | ORAL_TABLET | Freq: Every day | ORAL | 3 refills | Status: DC

## 2022-01-02 ENCOUNTER — Ambulatory Visit (INDEPENDENT_AMBULATORY_CARE_PROVIDER_SITE_OTHER): Payer: Medicare Other | Admitting: Neurology

## 2022-01-02 ENCOUNTER — Encounter: Payer: Self-pay | Admitting: Internal Medicine

## 2022-01-02 DIAGNOSIS — R410 Disorientation, unspecified: Secondary | ICD-10-CM

## 2022-01-02 DIAGNOSIS — G3184 Mild cognitive impairment, so stated: Secondary | ICD-10-CM

## 2022-01-23 DIAGNOSIS — N281 Cyst of kidney, acquired: Secondary | ICD-10-CM | POA: Diagnosis not present

## 2022-01-23 DIAGNOSIS — R3912 Poor urinary stream: Secondary | ICD-10-CM | POA: Diagnosis not present

## 2022-01-29 ENCOUNTER — Encounter: Payer: Self-pay | Admitting: Internal Medicine

## 2022-01-29 ENCOUNTER — Inpatient Hospital Stay (HOSPITAL_BASED_OUTPATIENT_CLINIC_OR_DEPARTMENT_OTHER): Payer: Medicare Other | Admitting: Internal Medicine

## 2022-01-29 ENCOUNTER — Other Ambulatory Visit: Payer: Self-pay

## 2022-01-29 ENCOUNTER — Inpatient Hospital Stay: Payer: Medicare Other | Attending: Internal Medicine

## 2022-01-29 ENCOUNTER — Ambulatory Visit (INDEPENDENT_AMBULATORY_CARE_PROVIDER_SITE_OTHER): Payer: Medicare Other

## 2022-01-29 ENCOUNTER — Ambulatory Visit (INDEPENDENT_AMBULATORY_CARE_PROVIDER_SITE_OTHER): Payer: Medicare Other | Admitting: Internal Medicine

## 2022-01-29 VITALS — BP 124/78 | HR 96 | Temp 97.9°F | Resp 19 | Ht 67.0 in | Wt 204.4 lb

## 2022-01-29 VITALS — BP 126/80 | HR 96 | Resp 18 | Ht 67.0 in | Wt 206.4 lb

## 2022-01-29 DIAGNOSIS — R1031 Right lower quadrant pain: Secondary | ICD-10-CM

## 2022-01-29 DIAGNOSIS — F5101 Primary insomnia: Secondary | ICD-10-CM | POA: Diagnosis not present

## 2022-01-29 DIAGNOSIS — D45 Polycythemia vera: Secondary | ICD-10-CM

## 2022-01-29 DIAGNOSIS — N289 Disorder of kidney and ureter, unspecified: Secondary | ICD-10-CM | POA: Insufficient documentation

## 2022-01-29 DIAGNOSIS — I10 Essential (primary) hypertension: Secondary | ICD-10-CM | POA: Insufficient documentation

## 2022-01-29 DIAGNOSIS — Z79899 Other long term (current) drug therapy: Secondary | ICD-10-CM | POA: Diagnosis not present

## 2022-01-29 DIAGNOSIS — D751 Secondary polycythemia: Secondary | ICD-10-CM | POA: Diagnosis not present

## 2022-01-29 DIAGNOSIS — M25552 Pain in left hip: Secondary | ICD-10-CM | POA: Diagnosis not present

## 2022-01-29 DIAGNOSIS — R1032 Left lower quadrant pain: Secondary | ICD-10-CM

## 2022-01-29 DIAGNOSIS — E7800 Pure hypercholesterolemia, unspecified: Secondary | ICD-10-CM

## 2022-01-29 DIAGNOSIS — R739 Hyperglycemia, unspecified: Secondary | ICD-10-CM

## 2022-01-29 DIAGNOSIS — M25551 Pain in right hip: Secondary | ICD-10-CM | POA: Diagnosis not present

## 2022-01-29 LAB — CBC WITH DIFFERENTIAL (CANCER CENTER ONLY)
Abs Immature Granulocytes: 0.02 10*3/uL (ref 0.00–0.07)
Basophils Absolute: 0.1 10*3/uL (ref 0.0–0.1)
Basophils Relative: 1 %
Eosinophils Absolute: 0.1 10*3/uL (ref 0.0–0.5)
Eosinophils Relative: 1 %
HCT: 48.9 % (ref 39.0–52.0)
Hemoglobin: 16.7 g/dL (ref 13.0–17.0)
Immature Granulocytes: 0 %
Lymphocytes Relative: 18 %
Lymphs Abs: 1.1 10*3/uL (ref 0.7–4.0)
MCH: 30.2 pg (ref 26.0–34.0)
MCHC: 34.2 g/dL (ref 30.0–36.0)
MCV: 88.4 fL (ref 80.0–100.0)
Monocytes Absolute: 0.6 10*3/uL (ref 0.1–1.0)
Monocytes Relative: 9 %
Neutro Abs: 4.5 10*3/uL (ref 1.7–7.7)
Neutrophils Relative %: 71 %
Platelet Count: 173 10*3/uL (ref 150–400)
RBC: 5.53 MIL/uL (ref 4.22–5.81)
RDW: 13.2 % (ref 11.5–15.5)
WBC Count: 6.4 10*3/uL (ref 4.0–10.5)
nRBC: 0 % (ref 0.0–0.2)

## 2022-01-29 LAB — CMP (CANCER CENTER ONLY)
ALT: 25 U/L (ref 0–44)
AST: 24 U/L (ref 15–41)
Albumin: 4.5 g/dL (ref 3.5–5.0)
Alkaline Phosphatase: 69 U/L (ref 38–126)
Anion gap: 6 (ref 5–15)
BUN: 20 mg/dL (ref 8–23)
CO2: 29 mmol/L (ref 22–32)
Calcium: 9.7 mg/dL (ref 8.9–10.3)
Chloride: 103 mmol/L (ref 98–111)
Creatinine: 1.91 mg/dL — ABNORMAL HIGH (ref 0.61–1.24)
GFR, Estimated: 38 mL/min — ABNORMAL LOW (ref >60)
Glucose, Bld: 112 mg/dL — ABNORMAL HIGH (ref 70–99)
Potassium: 4.7 mmol/L (ref 3.5–5.1)
Sodium: 138 mmol/L (ref 135–145)
Total Bilirubin: 0.9 mg/dL (ref 0.3–1.2)
Total Protein: 7.9 g/dL (ref 6.5–8.1)

## 2022-01-29 LAB — LACTATE DEHYDROGENASE: LDH: 126 U/L (ref 98–192)

## 2022-01-29 NOTE — Progress Notes (Signed)
Patient ID: Bradley Riley, male   DOB: 03-04-1956, 66 y.o.   MRN: [id number] ? ?

## 2022-01-29 NOTE — Patient Instructions (Signed)
Please continue all other medications as before, and ok to take tylenol as needed for the pain ? ?Please have the pharmacy call with any other refills you may need. ? ?Please continue your efforts at being more active, low cholesterol diet, and weight control. ? ?Please keep your appointments with your specialists as you may have planned ? ?Please go to the XRAY Department in the first floor for the x-ray testing ? ?You will be contacted by phone if any changes need to be made immediately.  Otherwise, you will receive a letter about your results with an explanation, but please check with MyChart first. ? ?Please remember to sign up for MyChart if you have not done so, as this will be important to you in the future with finding out test results, communicating by private email, and scheduling acute appointments online when needed. ? ?Please make an Appointment to return in 6 months, or sooner if needed ?

## 2022-01-29 NOTE — Progress Notes (Signed)
?    [facility] ?[phone]   [phone] ? ?OFFICE PROGRESS NOTE ? ?Biagio Borg, MD ?[place] [address] ? 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 ?PRIOR THERAPY: None ? ?CURRENT THERAPY: Phlebotomy on as-needed basis. ? ?INTERVAL HISTORY: ?KELLAN RAFFIELD 66 y.o. male returns to the clinic today for follow-up visit accompanied by his daughter [name].  The patient is feeling fine today with no concerning complaints.  He was found on recent imaging studies of the head to have suspicious lacunar infarct of indeterminate age.  He had further work-up that was unremarkable.  He denied having any current chest pain, shortness of breath, cough or hemoptysis.  He denied having any fever or chills.  He has no nausea, vomiting, diarrhea or constipation.  He has no headache or visual changes.  He underwent phlebotomy at the [facility] recently.  He is here today for evaluation and repeat blood work. ?  ?MEDICAL HISTORY: ?Past Medical History:  ?Diagnosis Date  ? GERD 07/29/2007  ? Qualifier: Diagnosis of  By: Garen Grams    ? HEPATITIS A, VIRAL, W/O HEPATIC COMA 07/29/2007  ? Qualifier: Diagnosis of  By: Garen Grams    ? History of renal stone 12/29/2013  ? Hyperlipidemia   ? HYPERTENSION 07/29/2007  ? Qualifier: Diagnosis of  By: Garen Grams    ? HYPERTHYROIDISM 07/29/2007  ? Qualifier: Diagnosis of  By: Garen Grams    ? Other and unspecified hyperlipidemia 07/29/2007  ? Centricity Description: DYSLIPIDEMIA Qualifier: Diagnosis of  By: Garen Grams   Centricity Description: HYPERLIPIDEMIA Qualifier: Diagnosis of  By: Garen Grams    ? Polycythemia 12/29/2013  ? UTI (lower urinary tract infection)   ? ? ?ALLERGIES:  has No Known Allergies. ? ?MEDICATIONS:  ?Current Outpatient Medications  ?Medication Sig Dispense Refill  ? amlodipine-olmesartan (AZOR) 10-20 MG tablet Take 1 tablet by mouth daily. 90  tablet 3  ? aspirin EC 81 MG tablet Take 1 tablet (81 mg total) by mouth daily. Swallow whole. 30 tablet 11  ? atorvastatin (LIPITOR) 40 MG tablet Take 1 tablet (40 mg total) by mouth daily. 30 tablet 2  ? clopidogrel (PLAVIX) 75 MG tablet Take 1 tablet (75 mg total) by mouth daily. 30 tablet 11  ? folic acid (FOLVITE) 1 MG tablet Take 1 tablet (1 mg total) by mouth daily. 100 tablet 3  ? zolpidem (AMBIEN) 10 MG tablet Take 1 tablet (10 mg total) by mouth at bedtime as needed for sleep. 90 tablet 1  ? ?No current facility-administered medications for this visit.  ? ? ?SURGICAL HISTORY: History reviewed. No pertinent surgical history. ? ?REVIEW OF SYSTEMS:  A comprehensive review of systems was negative.  ? ?PHYSICAL EXAMINATION: General appearance: alert, cooperative and no distress ?Head: Normocephalic, without obvious abnormality, atraumatic ?Neck: no adenopathy, no JVD, supple, symmetrical, trachea midline and thyroid not enlarged, symmetric, no tenderness/mass/nodules ?Lymph nodes: Cervical, supraclavicular, and axillary nodes normal. ?Resp: clear to auscultation bilaterally ?Back: symmetric, no curvature. ROM normal. No CVA tenderness. ?Cardio: regular rate and rhythm, S1, S2 normal, no murmur, click, rub or gallop ?GI: soft, non-tender; bowel sounds normal; no masses,  no organomegaly ?Extremities: extremities normal, atraumatic, no cyanosis or edema ? ?ECOG PERFORMANCE STATUS: 0 - Asymptomatic ? ?Blood pressure 124/78, pulse 96, temperature 97.9 ?F (36.6 ?C), temperature source Tympanic, resp. rate 19, height 5\' 7"  (1.702 m), weight 204 lb 6.4 oz (92.7 kg), SpO2  98 %. ? ?LABORATORY DATA: ?Lab Results  ?Component Value Date  ? WBC 6.4 01/29/2022  ? HGB 16.7 01/29/2022  ? HCT 48.9 01/29/2022  ? MCV 88.4 01/29/2022  ? PLT 173 01/29/2022  ? ? ?  Chemistry   ?   ?Component Value Date/Time  ? NA 138 01/29/2022 1036  ? NA 138 07/15/2017 1117  ? K 4.7 01/29/2022 1036  ? K 4.0 07/15/2017 1117  ? CL 103 01/29/2022 1036   ? CO2 29 01/29/2022 1036  ? CO2 24 07/15/2017 1117  ? BUN 20 01/29/2022 1036  ? BUN 21.8 07/15/2017 1117  ? CREATININE 1.91 (H) 01/29/2022 1036  ? CREATININE 1.6 (H) 07/15/2017 1117  ?    ?Component Value Date/Time  ? CALCIUM 9.7 01/29/2022 1036  ? CALCIUM 9.6 07/15/2017 1117  ? ALKPHOS 69 01/29/2022 1036  ? ALKPHOS 66 07/15/2017 1117  ? AST 24 01/29/2022 1036  ? AST 17 07/15/2017 1117  ? ALT 25 01/29/2022 1036  ? ALT 19 07/15/2017 1117  ? BILITOT 0.9 01/29/2022 1036  ? BILITOT 0.85 07/15/2017 1117  ?  ? ? ? ?RADIOGRAPHIC STUDIES: ?EEG adult ? ?Result Date: 01/06/2022 ? [facility] [address] [place]. [place] [address] [phone]      Electroencephalogram Procedure Note Mr. Orpha Bur Date of Birth:  1956/04/18 Medical Record Number:  [id number] Indications: Diagnostic Date of Procedure : 01/02/2022 medications: none Clinical history : 66 year old patient being evaluated for memory loss Technical Description This study was performed using 17 channel digital electroencephalographic recording equipment. International 10-20 electrode placement was used. The record was obtained with the patient awake, drowsy and asleep.  The record is of good technical quality for purposes of interpretation. Activation Procedures:  hyperventilation and photic stimulation . EEG Description Awake: Alpha Activity: The waking state record contains a well-defined bi-occipital alpha rhythm of  moderate amplitude with a dominant frequency of 9-10 Hz. Reactivity is present. No paroxsymal activity, spikes, or sharp waves are noted. Technical component of study is adequate. EKG tracing shows regular sinus rhythm Length of this recording is 23 minutes and 3 seconds Sleep: With drowsiness, there is attenuation of the background alpha activity. As the patient enters into light sleep, vertex waves and symmetrical spindles are noted. K complexes are noted in sleep. Transition to the waking state is unremarkable. Result of Activation  Procedures: Hyperventilation: Hyperventilation for three minutes fails to activate the recording. Photo Stimulation: No photic driving response is noted. Summary Normal electroencephalogram, awake, asleep and with activation procedures. There are no focal lateralizing or epileptiform features.   ? 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 ?Our goal is to keep his hematocrit between 45-50%. ?Repeat CBC today showed hemoglobin of 16.7 and hematocrit 48.9%.  The patient has normal total white blood count as well as platelets count. ?I recommended for him to continue on observation but also advised him to consider repeating phlebotomy in around 2 months from now.  He is planning to travel to [place] next month and I will arrange for him to have a follow-up appointment with me in the middle of May 2023. ?For the hypertension and renal insufficiency, he is seeing his primary care physician Dr. Jenny Reichmann later today. ?He was advised to call immediately if he has any other concerning symptoms in the interval. ?The patient voices understanding of current disease status and treatment options and  is in agreement with the current care plan. ? ?All questions were answered. The patient knows to call the clinic with any problems, questions or concerns. We can certainly see the patient much sooner if necessary. ? ?Disclaimer: This note was dictated with voice recognition software. Similar sounding words can inadvertently be transcribed and may not be corrected upon review. ? ? ?  ?  ?

## 2022-01-29 NOTE — Progress Notes (Signed)
Patient ID: Bradley Riley, male   DOB: 04/09/1956, 66 y.o.   MRN: [id number] ? ? ? ?    Chief Complaint: follow up HTN, HLD, memory changes, sleep disorder, right > let groin pain ? ?     HPI:  Bradley Riley is a 66 y.o. male here with daughter newly RN; father with mild worsening memory changes in last 3 months it seems without HA or other focal neuro s/s.  Has neurology f/u soon.  Also worsening sleep difficult, hesitant to take the Lorrin Mais though it has helped when he did take it.  Has no definite objection such as side effect it seems.  Has been newly started lipitor and tolerating well.  BP has been < 140/90 at home as well.  Does have 1 days hx of right > left bilateral groin pain only worse to walk, no gait problem or falls.   ?      ?Wt Readings from Last 3 Encounters:  ?01/29/22 206 lb 6.4 oz (93.6 kg)  ?01/29/22 204 lb 6.4 oz (92.7 kg)  ?12/25/21 204 lb (92.5 kg)  ? ?BP Readings from Last 3 Encounters:  ?01/29/22 126/80  ?01/29/22 124/78  ?12/25/21 126/87  ? ?      ?Past Medical History:  ?Diagnosis Date  ? GERD 07/29/2007  ? Qualifier: Diagnosis of  By: Garen Grams    ? HEPATITIS A, VIRAL, W/O HEPATIC COMA 07/29/2007  ? Qualifier: Diagnosis of  By: Garen Grams    ? History of renal stone 12/29/2013  ? Hyperlipidemia   ? HYPERTENSION 07/29/2007  ? Qualifier: Diagnosis of  By: Garen Grams    ? HYPERTHYROIDISM 07/29/2007  ? Qualifier: Diagnosis of  By: Garen Grams    ? Other and unspecified hyperlipidemia 07/29/2007  ? Centricity Description: DYSLIPIDEMIA Qualifier: Diagnosis of  By: Garen Grams   Centricity Description: HYPERLIPIDEMIA Qualifier: Diagnosis of  By: Garen Grams    ? Polycythemia 12/29/2013  ? UTI (lower urinary tract infection)   ? ?History reviewed. No pertinent surgical history. ? reports that he has never smoked. He uses smokeless tobacco. He reports that he does not drink alcohol and does not use drugs. ?family history includes Hypertension in his father and mother. ?No Known  Allergies ?Current Outpatient Medications on File Prior to Visit  ?Medication Sig Dispense Refill  ? amlodipine-olmesartan (AZOR) 10-20 MG tablet Take 1 tablet by mouth daily. 90 tablet 3  ? aspirin EC 81 MG tablet Take 1 tablet (81 mg total) by mouth daily. Swallow whole. 30 tablet 11  ? atorvastatin (LIPITOR) 40 MG tablet Take 1 tablet (40 mg total) by mouth daily. 30 tablet 2  ? clopidogrel (PLAVIX) 75 MG tablet Take 1 tablet (75 mg total) by mouth daily. 30 tablet 11  ? folic acid (FOLVITE) 1 MG tablet Take 1 tablet (1 mg total) by mouth daily. (Patient not taking: Reported on 01/29/2022) 100 tablet 3  ? zolpidem (AMBIEN) 10 MG tablet Take 1 tablet (10 mg total) by mouth at bedtime as needed for sleep. (Patient not taking: Reported on 01/29/2022) 90 tablet 1  ? ?No current facility-administered medications on file prior to visit.  ? ?     ROS:  All others reviewed and negative. ? ?Objective  ? ?     PE:  BP 126/80   Pulse 96   Resp 18   Ht 5\' 7"  (1.702 m)   Wt 206 lb 6.4 oz (93.6 kg)   SpO2 96%   BMI  32.33 kg/m?  ? ?              Constitutional: Pt appears in NAD ?              HENT: Head: NCAT.  ?              Right Ear: External ear normal.   ?              Left Ear: External ear normal.  ?              Eyes: . Pupils are equal, round, and reactive to light. Conjunctivae and EOM are normal ?              Nose: without d/c or deformity ?              Neck: Neck supple. Gross normal ROM ?              Cardiovascular: Normal rate and regular rhythm.   ?              Pulmonary/Chest: Effort normal and breath sounds without rales or wheezing.  ?              Abd:  Soft, NT, ND, + BS, no organomegaly ?              Neurological: Pt is alert. At baseline orientation, motor grossly intact ?              Skin: Skin is warm. No rashes, no other new lesions, LE edema - none ?              Psychiatric: Pt behavior is normal without agitation  ? ?Micro: none ? ?Cardiac tracings I have personally interpreted today:   none ? ?Pertinent Radiological findings (summarize): none  ? ?Lab Results  ?Component Value Date  ? WBC 6.4 01/29/2022  ? HGB 16.7 01/29/2022  ? HCT 48.9 01/29/2022  ? PLT 173 01/29/2022  ? GLUCOSE 112 (H) 01/29/2022  ? CHOL 172 11/13/2021  ? TRIG 122.0 11/13/2021  ? HDL 33.40 (L) 11/13/2021  ? LDLDIRECT 112.0 07/08/2017  ? LDLCALC 114 (H) 11/13/2021  ? ALT 25 01/29/2022  ? AST 24 01/29/2022  ? NA 138 01/29/2022  ? K 4.7 01/29/2022  ? CL 103 01/29/2022  ? CREATININE 1.91 (H) 01/29/2022  ? BUN 20 01/29/2022  ? CO2 29 01/29/2022  ? TSH 2.310 12/25/2021  ? PSA 2.79 11/13/2021  ? INR 1.1 12/21/2021  ? HGBA1C 5.9 11/13/2021  ? ?Assessment/Plan:  ?Bradley Riley is a 66 y.o. White or Caucasian [1] male with  has a past medical history of GERD (07/29/2007), HEPATITIS A, VIRAL, W/O HEPATIC COMA (07/29/2007), History of renal stone (12/29/2013), Hyperlipidemia, HYPERTENSION (07/29/2007), HYPERTHYROIDISM (07/29/2007), Other and unspecified hyperlipidemia (07/29/2007), Polycythemia (12/29/2013), and UTI (lower urinary tract infection). ? ?Essential hypertension ?BP Readings from Last 3 Encounters:  ?01/29/22 126/80  ?01/29/22 124/78  ?12/25/21 126/87  ? ?Stable, pt to continue medical treatment azor ? ? ?Hyperglycemia ?Lab Results  ?Component Value Date  ? HGBA1C 5.9 11/13/2021  ? ?Stable, pt to continue current medical treatment  - diet ? ? ?Hyperlipidemia ?Lab Results  ?Component Value Date  ? LDLCALC 114 (H) 11/13/2021  ? ?Uncontrolled, goal < 70 tolerating new lipitor,, pt to continue current statin and lower chol diet ? ? ?Insomnia ?Uncontrolled, Lorrin Mais has worked well without side effect, pt simply hesitates to take, pt reassured, ok to cont [place]  qhs prn ? ?Left groin pain ?Etiology unclear, cant r/o djd or avn- for plain film to start,  to f/u any worsening symptoms or concerns ? ?Right groin pain ?Etiology unclear, cant r/o djd or avn- for plain film to start,  to f/u any worsening symptoms or concerns ? ?Followup: Return in  about 6 months (around 08/01/2022). ? ?Cathlean Cower, MD 01/30/2022 9:49 PM ?[place] ?[place] ?Internal Medicine ?

## 2022-01-30 ENCOUNTER — Encounter: Payer: Self-pay | Admitting: Internal Medicine

## 2022-01-30 NOTE — Assessment & Plan Note (Signed)
Etiology unclear, cant r/o djd or avn- for plain film to start,  to f/u any worsening symptoms or concerns ?

## 2022-01-30 NOTE — Assessment & Plan Note (Signed)
BP Readings from Last 3 Encounters:  ?01/29/22 126/80  ?01/29/22 124/78  ?12/25/21 126/87  ? ?Stable, pt to continue medical treatment azor ? ?

## 2022-01-30 NOTE — Assessment & Plan Note (Signed)
Lab Results  ?Component Value Date  ? LDLCALC 114 (H) 11/13/2021  ? ?Uncontrolled, goal < 70 tolerating new lipitor,, pt to continue current statin and lower chol diet ? ?

## 2022-01-30 NOTE — Assessment & Plan Note (Signed)
Lab Results  ?Component Value Date  ? HGBA1C 5.9 11/13/2021  ? ?Stable, pt to continue current medical treatment  - diet ? ?

## 2022-01-30 NOTE — Assessment & Plan Note (Signed)
Uncontrolled, Bradley Riley has worked well without side effect, pt simply hesitates to take, pt reassured, ok to cont ambien qhs prn ?

## 2022-02-06 ENCOUNTER — Encounter: Payer: Self-pay | Admitting: Internal Medicine

## 2022-02-19 ENCOUNTER — Ambulatory Visit: Payer: BLUE CROSS/BLUE SHIELD | Admitting: Internal Medicine

## 2022-02-19 ENCOUNTER — Other Ambulatory Visit: Payer: BLUE CROSS/BLUE SHIELD

## 2022-03-10 ENCOUNTER — Institutional Professional Consult (permissible substitution): Payer: Medicare Other | Admitting: Neurology

## 2022-03-21 ENCOUNTER — Other Ambulatory Visit: Payer: Self-pay | Admitting: Neurology

## 2022-03-28 ENCOUNTER — Encounter: Payer: Self-pay | Admitting: Internal Medicine

## 2022-04-15 ENCOUNTER — Telehealth: Payer: Self-pay | Admitting: Neurology

## 2022-04-15 NOTE — Telephone Encounter (Signed)
LVM and sent mychart msg informing pt of r/s needed for 6/27 appt- Dr. Leonie Man out. ?

## 2022-04-29 ENCOUNTER — Inpatient Hospital Stay: Payer: Medicare Other | Attending: Internal Medicine | Admitting: Internal Medicine

## 2022-04-29 ENCOUNTER — Inpatient Hospital Stay: Payer: Medicare Other

## 2022-05-27 ENCOUNTER — Ambulatory Visit: Payer: Medicare Other | Admitting: Neurology

## 2022-06-19 ENCOUNTER — Ambulatory Visit (INDEPENDENT_AMBULATORY_CARE_PROVIDER_SITE_OTHER): Payer: Medicare Other | Admitting: Internal Medicine

## 2022-06-19 ENCOUNTER — Encounter: Payer: Self-pay | Admitting: Internal Medicine

## 2022-06-19 VITALS — BP 130/90 | HR 85 | Temp 99.1°F | Ht 67.0 in | Wt 203.0 lb

## 2022-06-19 DIAGNOSIS — R739 Hyperglycemia, unspecified: Secondary | ICD-10-CM | POA: Diagnosis not present

## 2022-06-19 DIAGNOSIS — N1831 Chronic kidney disease, stage 3a: Secondary | ICD-10-CM

## 2022-06-19 DIAGNOSIS — D45 Polycythemia vera: Secondary | ICD-10-CM | POA: Diagnosis not present

## 2022-06-19 DIAGNOSIS — Z8673 Personal history of transient ischemic attack (TIA), and cerebral infarction without residual deficits: Secondary | ICD-10-CM | POA: Insufficient documentation

## 2022-06-19 DIAGNOSIS — K922 Gastrointestinal hemorrhage, unspecified: Secondary | ICD-10-CM | POA: Diagnosis not present

## 2022-06-19 DIAGNOSIS — E538 Deficiency of other specified B group vitamins: Secondary | ICD-10-CM

## 2022-06-19 DIAGNOSIS — I10 Essential (primary) hypertension: Secondary | ICD-10-CM | POA: Diagnosis not present

## 2022-06-19 DIAGNOSIS — E559 Vitamin D deficiency, unspecified: Secondary | ICD-10-CM | POA: Diagnosis not present

## 2022-06-19 DIAGNOSIS — Z0001 Encounter for general adult medical examination with abnormal findings: Secondary | ICD-10-CM

## 2022-06-19 DIAGNOSIS — E7800 Pure hypercholesterolemia, unspecified: Secondary | ICD-10-CM

## 2022-06-19 DIAGNOSIS — Z125 Encounter for screening for malignant neoplasm of prostate: Secondary | ICD-10-CM

## 2022-06-19 LAB — CBC WITH DIFFERENTIAL/PLATELET
Basophils Absolute: 0.1 10*3/uL (ref 0.0–0.1)
Basophils Relative: 1 % (ref 0.0–3.0)
Eosinophils Absolute: 0.1 10*3/uL (ref 0.0–0.7)
Eosinophils Relative: 1.1 % (ref 0.0–5.0)
HCT: 43.5 % (ref 39.0–52.0)
Hemoglobin: 13.8 g/dL (ref 13.0–17.0)
Lymphocytes Relative: 18.4 % (ref 12.0–46.0)
Lymphs Abs: 1.1 10*3/uL (ref 0.7–4.0)
MCHC: 31.8 g/dL (ref 30.0–36.0)
MCV: 76.3 fl — ABNORMAL LOW (ref 78.0–100.0)
Monocytes Absolute: 0.7 10*3/uL (ref 0.1–1.0)
Monocytes Relative: 12.2 % — ABNORMAL HIGH (ref 3.0–12.0)
Neutro Abs: 4 10*3/uL (ref 1.4–7.7)
Neutrophils Relative %: 67.3 % (ref 43.0–77.0)
Platelets: 175 10*3/uL (ref 150.0–400.0)
RBC: 5.7 Mil/uL (ref 4.22–5.81)
RDW: 17.5 % — ABNORMAL HIGH (ref 11.5–15.5)
WBC: 5.9 10*3/uL (ref 4.0–10.5)

## 2022-06-19 LAB — BASIC METABOLIC PANEL
BUN: 24 mg/dL — ABNORMAL HIGH (ref 6–23)
CO2: 26 mEq/L (ref 19–32)
Calcium: 9.3 mg/dL (ref 8.4–10.5)
Chloride: 103 mEq/L (ref 96–112)
Creatinine, Ser: 1.61 mg/dL — ABNORMAL HIGH (ref 0.40–1.50)
GFR: 44.16 mL/min — ABNORMAL LOW (ref >60.00)
Glucose, Bld: 84 mg/dL (ref 70–99)
Potassium: 4.6 mEq/L (ref 3.5–5.1)
Sodium: 139 mEq/L (ref 135–145)

## 2022-06-19 LAB — HEPATIC FUNCTION PANEL
ALT: 13 U/L (ref 0–53)
AST: 16 U/L (ref 0–37)
Albumin: 4.6 g/dL (ref 3.5–5.2)
Alkaline Phosphatase: 63 U/L (ref 39–117)
Bilirubin, Direct: 0.1 mg/dL (ref 0.0–0.3)
Total Bilirubin: 0.7 mg/dL (ref 0.2–1.2)
Total Protein: 7.5 g/dL (ref 6.0–8.3)

## 2022-06-19 LAB — URINALYSIS, ROUTINE W REFLEX MICROSCOPIC
Bilirubin Urine: NEGATIVE
Hgb urine dipstick: NEGATIVE
Ketones, ur: NEGATIVE
Leukocytes,Ua: NEGATIVE
Nitrite: NEGATIVE
RBC / HPF: NONE SEEN (ref 0–?)
Specific Gravity, Urine: 1.015 (ref 1.000–1.030)
Urine Glucose: NEGATIVE
Urobilinogen, UA: 0.2 (ref 0.0–1.0)
pH: 6 (ref 5.0–8.0)

## 2022-06-19 LAB — LIPID PANEL
Cholesterol: 143 mg/dL (ref 0–200)
HDL: 36 mg/dL — ABNORMAL LOW (ref >39.00)
LDL Cholesterol: 78 mg/dL (ref 0–99)
NonHDL: 106.91
Total CHOL/HDL Ratio: 4
Triglycerides: 144 mg/dL (ref 0.0–149.0)
VLDL: 28.8 mg/dL (ref 0.0–40.0)

## 2022-06-19 LAB — TSH: TSH: 2.26 u[IU]/mL (ref 0.35–5.50)

## 2022-06-19 LAB — VITAMIN B12: Vitamin B-12: 259 pg/mL (ref 211–911)

## 2022-06-19 LAB — HEMOGLOBIN A1C: Hgb A1c MFr Bld: 6.1 % (ref 4.6–6.5)

## 2022-06-19 LAB — PSA: PSA: 1.48 ng/mL (ref 0.10–4.00)

## 2022-06-19 LAB — VITAMIN D 25 HYDROXY (VIT D DEFICIENCY, FRACTURES): VITD: 23.48 ng/mL — ABNORMAL LOW (ref 30.00–100.00)

## 2022-06-19 NOTE — Patient Instructions (Signed)
Please continue all other medications as before, including to HOLD on taking the Aspirin and Plavix for now  Please have the pharmacy call with any other refills you may need.  Please continue your efforts at being more active, low cholesterol diet, and weight control.  You are otherwise up to date with prevention measures today.  Please keep your appointments with your specialists as you may have planned  You will be contacted regarding the referral for: Neurology Dr Leonie Man, Gastroenterology, and Hematology Dr Earlie Server  Please go to the LAB at the blood drawing area for the tests to be done  You will be contacted by phone if any changes need to be made immediately.  Otherwise, you will receive a letter about your results with an explanation, but please check with MyChart first.  Please remember to sign up for MyChart if you have not done so, as this will be important to you in the future with finding out test results, communicating by private email, and scheduling acute appointments online when needed.  Please make an Appointment to return in 6 months, or sooner if needed

## 2022-06-19 NOTE — Progress Notes (Signed)
Patient ID: Bradley Riley, male   DOB: 05-04-1956, 66 y.o.   MRN: [id number]         Chief Complaint:: wellness exam and recent hx of GI bleed       HPI:  Bradley Riley is a 66 y.o. male here for wellness exam; decliens covid booster, shingrix, tdap and colonoscopy for now; up to date                        Also here after recent GI bleeding episode while traveling in [place] visiting family in April 24 - 29, 2023, on ASA and Plavix due to hx of stroke, he reports getting 3 u PRBc and iron infusion while hospd at the [facility]; has been off the asa/plavix since then. At one point pt did restart the asa/plavix for a few days but woke up one day with blood in month and pillow so stopped again.  Daughter with him today hoping for f/u referrals to GI, neurology.  Pt reports colonoscopy was abnormal with "bruise" as cause for bleeding and diverticulosis. Had EGD as well.  It as mentioned to pt he might benefit from what sounds like capsule endoscopy.  Has been feeling somewhat dizzy and weak lately for unclear reason, no overt bleeding but was hoping for f/u lab today as well.    Wt Readings from Last 3 Encounters:  06/19/22 203 lb (92.1 kg)  01/29/22 206 lb 6.4 oz (93.6 kg)  01/29/22 204 lb 6.4 oz (92.7 kg)   BP Readings from Last 3 Encounters:  06/19/22 130/90  01/29/22 126/80  01/29/22 124/78   Immunization History  Administered Date(s) Administered   Hepatitis B 04/26/1996, 05/03/1996, 03/27/1999   Hepatitis B, ped/adol 04/26/1996, 05/03/1996, 03/27/1999   Influenza,inj,Quad PF,6+ Mos 12/29/2013, 01/02/2015   Influenza-Unspecified 10/01/2018, 12/13/2018   PFIZER(Purple Top)SARS-COV-2 Vaccination 02/10/2020, 03/02/2020, 09/29/2020   PNEUMOCOCCAL CONJUGATE-20 11/13/2021   Td 01/12/2001   There are no preventive care reminders to display for this patient.     Past Medical History:  Diagnosis Date   GERD 07/29/2007   Qualifier: Diagnosis of  By: Garen Grams     HEPATITIS A,  VIRAL, W/O HEPATIC COMA 07/29/2007   Qualifier: Diagnosis of  By: Garen Grams     History of renal stone 12/29/2013   Hyperlipidemia    HYPERTENSION 07/29/2007   Qualifier: Diagnosis of  By: Garen Grams     HYPERTHYROIDISM 07/29/2007   Qualifier: Diagnosis of  By: Garen Grams     Other and unspecified hyperlipidemia 07/29/2007   Centricity Description: DYSLIPIDEMIA Qualifier: Diagnosis of  By: Garen Grams   Centricity Description: HYPERLIPIDEMIA Qualifier: Diagnosis of  By: Garen Grams     Polycythemia 12/29/2013   UTI (lower urinary tract infection)    History reviewed. No pertinent surgical history.  reports that he has never smoked. He uses smokeless tobacco. He reports that he does not drink alcohol and does not use drugs. family history includes Hypertension in his father and mother. No Known Allergies Current Outpatient Medications on File Prior to Visit  Medication Sig Dispense Refill   esomeprazole (NEXIUM) 40 MG capsule Take 40 mg by mouth daily at 12 noon.     Febuxostat 80 MG TABS Take 1 tablet by mouth daily.     Multiple Vitamins-Minerals (MEMORY VITE PO) Take by mouth.     rosuvastatin (CRESTOR) 5 MG tablet Take 5 mg by mouth daily.     tadalafil (CIALIS) 5  MG tablet Take 5 mg by mouth daily as needed for erectile dysfunction.     tamsulosin (FLOMAX) 0.4 MG CAPS capsule Take 0.4 mg by mouth daily.     VITAMIN E COMPLEX PO Take by mouth.     amlodipine-olmesartan (AZOR) 10-20 MG tablet Take 1 tablet by mouth daily. (Patient not taking: Reported on 06/19/2022) 90 tablet 3   aspirin EC 81 MG tablet Take 1 tablet (81 mg total) by mouth daily. Swallow whole. (Patient not taking: Reported on 06/19/2022) 30 tablet 11   clopidogrel (PLAVIX) 75 MG tablet Take 1 tablet (75 mg total) by mouth daily. (Patient not taking: Reported on 06/19/2022) 30 tablet 11   folic acid (FOLVITE) 1 MG tablet Take 1 tablet (1 mg total) by mouth daily. (Patient not taking: Reported on  01/29/2022) 100 tablet 3   zolpidem (AMBIEN) 10 MG tablet Take 1 tablet (10 mg total) by mouth at bedtime as needed for sleep. (Patient not taking: Reported on 01/29/2022) 90 tablet 1   No current facility-administered medications on file prior to visit.        ROS:  All others reviewed and negative.  Objective        PE:  BP 130/90 (BP Location: Right Arm, Patient Position: Sitting, Cuff Size: Large)   Pulse 85   Temp 99.1 F (37.3 C) (Oral)   Ht '5\' 7"'$  (1.702 m)   Wt 203 lb (92.1 kg)   SpO2 95%   BMI 31.79 kg/m                 Constitutional: Pt appears in NAD               HENT: Head: NCAT.                Right Ear: External ear normal.                 Left Ear: External ear normal.                Eyes: . Pupils are equal, round, and reactive to light. Conjunctivae and EOM are normal               Nose: without d/c or deformity               Neck: Neck supple. Gross normal ROM               Cardiovascular: Normal rate and regular rhythm.                 Pulmonary/Chest: Effort normal and breath sounds without rales or wheezing.                Abd:  Soft, NT, ND, + BS, no organomegaly               Neurological: Pt is alert. At baseline orientation, motor grossly intact               Skin: Skin is warm. No rashes, no other new lesions, LE edema - none               Psychiatric: Pt behavior is normal without agitation   Micro: none  Cardiac tracings I have personally interpreted today:  none  Pertinent Radiological findings (summarize): none   Lab Results  Component Value Date   WBC 5.9 06/19/2022   HGB 13.8 06/19/2022   HCT 43.5 06/19/2022   PLT 175.0 06/19/2022   GLUCOSE 84 06/19/2022  CHOL 143 06/19/2022   TRIG 144.0 06/19/2022   HDL 36.00 (L) 06/19/2022   LDLDIRECT 112.0 07/08/2017   LDLCALC 78 06/19/2022   ALT 13 06/19/2022   AST 16 06/19/2022   NA 139 06/19/2022   K 4.6 06/19/2022   CL 103 06/19/2022   CREATININE 1.61 (H) 06/19/2022   BUN 24 (H) 06/19/2022    CO2 26 06/19/2022   TSH 2.26 06/19/2022   PSA 1.48 06/19/2022   INR 1.1 12/21/2021   HGBA1C 6.1 06/19/2022   Assessment/Plan:  Bradley Riley is a 66 y.o. White or Caucasian [1] male with  has a past medical history of GERD (07/29/2007), HEPATITIS A, VIRAL, W/O HEPATIC COMA (07/29/2007), History of renal stone (12/29/2013), Hyperlipidemia, HYPERTENSION (07/29/2007), HYPERTHYROIDISM (07/29/2007), Other and unspecified hyperlipidemia (07/29/2007), Polycythemia (12/29/2013), and UTI (lower urinary tract infection).  Encounter for well adult exam with abnormal findings Age and sex appropriate education and counseling updated with regular exercise and diet Referrals for preventative services - declines colonoscopy Immunizations addressed - decliens shingrix, tdap, covid booster Smoking counseling  - none needed Evidence for depression or other mood disorder - none significant Most recent labs reviewed. I have personally reviewed and have noted: 1) the patient's medical and social history 2) The patient's current medications and supplements 3) The patient's height, weight, and BMI have been recorded in the chart   Polycythemia vera ([place]) Also for f/u referral to hematology as last seen approx 2 yrs  Hyperlipidemia Lab Results  Component Value Date   LDLCALC 78 06/19/2022   Stable, pt to continue current statin crestor 5 mg per day   Hyperglycemia Lab Results  Component Value Date   HGBA1C 6.1 06/19/2022   Stable, pt to continue current medical treatment  - diet, wt control   History of stroke Pt to continue Hold Asa and plavix for now -refer Stroke clinic neurology  Essential hypertension BP Readings from Last 3 Encounters:  06/19/22 130/90  01/29/22 126/80  01/29/22 124/78   Stable, pt to continue medical treatment azor 10-20 mg qd   CKD (chronic kidney disease), stage III Lab Results  Component Value Date   CREATININE 1.61 (H) 06/19/2022   Stable overall, cont to avoid  nephrotoxins   Gastrointestinal hemorrhage Etiology unclear, for f/u lab today, pt asked to obtain records; also refer GI  Vitamin D deficiency Last vitamin D Lab Results  Component Value Date   VD25OH 23.48 (L) 06/19/2022   lOw, to start oral replacement  Followup: Return in about 6 months (around 12/20/2022).  Cathlean Cower, MD 06/22/2022 11:30 AM [facility]

## 2022-06-22 ENCOUNTER — Encounter: Payer: Self-pay | Admitting: Internal Medicine

## 2022-06-22 DIAGNOSIS — K922 Gastrointestinal hemorrhage, unspecified: Secondary | ICD-10-CM | POA: Insufficient documentation

## 2022-06-22 DIAGNOSIS — E559 Vitamin D deficiency, unspecified: Secondary | ICD-10-CM | POA: Insufficient documentation

## 2022-06-22 NOTE — Assessment & Plan Note (Signed)
Last vitamin D Lab Results  Component Value Date   VD25OH 23.48 (L) 06/19/2022   lOw, to start oral replacement

## 2022-06-22 NOTE — Assessment & Plan Note (Signed)
Lab Results  Component Value Date   CREATININE 1.61 (H) 06/19/2022   Stable overall, cont to avoid nephrotoxins

## 2022-06-22 NOTE — Assessment & Plan Note (Signed)
Pt to continue Hold Asa and plavix for now -refer Stroke clinic neurology

## 2022-06-22 NOTE — Assessment & Plan Note (Signed)
Lab Results  Component Value Date   LDLCALC 78 06/19/2022   Stable, pt to continue current statin crestor 5 mg per day

## 2022-06-22 NOTE — Assessment & Plan Note (Signed)
Etiology unclear, for f/u lab today, pt asked to obtain records; also refer GI

## 2022-06-22 NOTE — Assessment & Plan Note (Signed)
Age and sex appropriate education and counseling updated with regular exercise and diet Referrals for preventative services - declines colonoscopy Immunizations addressed - decliens shingrix, tdap, covid booster Smoking counseling  - none needed Evidence for depression or other mood disorder - none significant Most recent labs reviewed. I have personally reviewed and have noted: 1) the patient's medical and social history 2) The patient's current medications and supplements 3) The patient's height, weight, and BMI have been recorded in the chart

## 2022-06-22 NOTE — Assessment & Plan Note (Signed)
Also for f/u referral to hematology as last seen approx 2 yrs

## 2022-06-22 NOTE — Assessment & Plan Note (Signed)
BP Readings from Last 3 Encounters:  06/19/22 130/90  01/29/22 126/80  01/29/22 124/78   Stable, pt to continue medical treatment azor 10-20 mg qd

## 2022-06-22 NOTE — Assessment & Plan Note (Signed)
Lab Results  Component Value Date   HGBA1C 6.1 06/19/2022   Stable, pt to continue current medical treatment  - diet, wt control

## 2022-06-25 ENCOUNTER — Telehealth: Payer: Self-pay | Admitting: Neurology

## 2022-06-25 ENCOUNTER — Telehealth: Payer: Self-pay | Admitting: Internal Medicine

## 2022-06-25 NOTE — Telephone Encounter (Signed)
Pt was referred to [place] again on 7/20. Pt already established with Dr. Julien Nordmann. Called pt, no answer. Left msg for pt to call back to r/s his missed appt with Dr. Julien Nordmann.

## 2022-06-25 NOTE — Telephone Encounter (Signed)
error 

## 2022-06-25 NOTE — Telephone Encounter (Signed)
FYI just scheduled this patient for a f/u on 8/22 with Dr. Leonie Man. Patient recently was traveling in [place] and had a GI bleed--they stopped his ASA and Plavix and he has been off of them since. PCP referred to discuss this and see if any changes need to made to his meds. Unable to get hospital records at this time from [place]. Please advise if anything patient should do in meantime before seeing [place] for f/u.

## 2022-07-01 NOTE — Telephone Encounter (Signed)
Message from Dr. Leonie Man: No stay off antiplatelets till he sees [place] or we get records to review from [place].  I spoke to the patient who verbalized understanding of the message above. He is unable to get the records from [place]. He will keep his pending appt on 07/22/22.

## 2022-07-09 ENCOUNTER — Other Ambulatory Visit: Payer: Self-pay

## 2022-07-09 ENCOUNTER — Encounter: Payer: Self-pay | Admitting: Internal Medicine

## 2022-07-09 ENCOUNTER — Inpatient Hospital Stay: Payer: Medicare Other | Attending: Internal Medicine | Admitting: Internal Medicine

## 2022-07-09 VITALS — BP 143/114 | HR 96 | Temp 98.4°F | Resp 16 | Wt 204.6 lb

## 2022-07-09 DIAGNOSIS — D45 Polycythemia vera: Secondary | ICD-10-CM

## 2022-07-09 DIAGNOSIS — D751 Secondary polycythemia: Secondary | ICD-10-CM | POA: Diagnosis not present

## 2022-07-09 DIAGNOSIS — Z79899 Other long term (current) drug therapy: Secondary | ICD-10-CM | POA: Insufficient documentation

## 2022-07-09 DIAGNOSIS — I10 Essential (primary) hypertension: Secondary | ICD-10-CM | POA: Diagnosis not present

## 2022-07-09 MED ORDER — INTEGRA PLUS PO CAPS: 1.0000 | ORAL_CAPSULE | Freq: Every day | ORAL | 1 refills | Status: DC

## 2022-07-09 MED ORDER — ESOMEPRAZOLE MAGNESIUM 40 MG PO CPDR: 40.0000 mg | DELAYED_RELEASE_CAPSULE | Freq: Every day | ORAL | 1 refills | Status: AC

## 2022-07-09 NOTE — Progress Notes (Signed)
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  INTERVAL HISTORY: Bradley Riley 66 y.o. male returns to the clinic today for follow-up visit accompanied by his daughter [name].  The patient is feeling fine today with no concerning complaints.  He has an episode of gastrointestinal hemorrhage when he was in [place] several months ago.  He was hypotensive and received 3 units of PRBCs transfusion as well as iron infusion.  He had upper endoscopy at that time that showed suspicious gastritis.  He was supposed to have capsule endoscopy but this was not done.  The patient was taking a lot of medications including aspirin, Plavix and NSAIDs which were discontinued.  He is currently on Nexium 40 mg p.o. daily.  He is here today for evaluation and recommendation regarding his condition.   MEDICAL HISTORY: Past Medical History:  Diagnosis Date   GERD 07/29/2007   Qualifier: Diagnosis of  By: Garen Grams     HEPATITIS A, VIRAL, W/O HEPATIC COMA 07/29/2007   Qualifier: Diagnosis of  By: Garen Grams     History of renal stone 12/29/2013   Hyperlipidemia    HYPERTENSION 07/29/2007   Qualifier: Diagnosis of  By: Garen Grams     HYPERTHYROIDISM 07/29/2007   Qualifier: Diagnosis of  By: Garen Grams     Other and unspecified hyperlipidemia 07/29/2007   Centricity Description: DYSLIPIDEMIA Qualifier: Diagnosis of  By: Garen Grams   Centricity Description: HYPERLIPIDEMIA Qualifier: Diagnosis of  By: Garen Grams     Polycythemia 12/29/2013   UTI (lower urinary tract infection)     ALLERGIES:  has No Known Allergies.  MEDICATIONS:  Current Outpatient Medications  Medication Sig Dispense Refill   esomeprazole  (NEXIUM) 40 MG capsule Take 40 mg by mouth daily at 12 noon.     Febuxostat 80 MG TABS Take 1 tablet by mouth daily.     Multiple Vitamins-Minerals (MEMORY VITE PO) Take by mouth.     rosuvastatin (CRESTOR) 5 MG tablet Take 5 mg by mouth daily.     tadalafil (CIALIS) 5 MG tablet Take 5 mg by mouth daily as needed for erectile dysfunction.     tamsulosin (FLOMAX) 0.4 MG CAPS capsule Take 0.4 mg by mouth daily.     VITAMIN E COMPLEX PO Take by mouth.     amlodipine-olmesartan (AZOR) 10-20 MG tablet Take 1 tablet by mouth daily. (Patient not taking: Reported on 06/19/2022) 90 tablet 3   aspirin EC 81 MG tablet Take 1 tablet (81 mg total) by mouth daily. Swallow whole. (Patient not taking: Reported on 06/19/2022) 30 tablet 11   clopidogrel (PLAVIX) 75 MG tablet Take 1 tablet (75 mg total) by mouth daily. (Patient not taking: Reported on 06/19/2022) 30 tablet 11   folic acid (FOLVITE) 1 MG tablet Take 1 tablet (1 mg total) by mouth daily. (Patient not taking: Reported on 01/29/2022) 100 tablet 3   zolpidem (AMBIEN) 10 MG tablet Take 1 tablet (10 mg total) by mouth at bedtime as needed for sleep. (Patient not taking: Reported on 01/29/2022) 90 tablet 1   No current facility-administered medications for this visit.    SURGICAL HISTORY: No past surgical history on file.  REVIEW OF SYSTEMS:  A comprehensive review of systems  was negative except for: Constitutional: positive for fatigue   PHYSICAL EXAMINATION: General appearance: alert, cooperative and no distress Head: Normocephalic, without obvious abnormality, atraumatic Neck: no adenopathy, no JVD, supple, symmetrical, trachea midline and thyroid not enlarged, symmetric, no tenderness/mass/nodules Lymph nodes: Cervical, supraclavicular, and axillary nodes normal. Resp: clear to auscultation bilaterally Back: symmetric, no curvature. ROM normal. No CVA tenderness. Cardio: regular rate and rhythm, S1, S2 normal, no murmur, click, rub or gallop GI: soft,  non-tender; bowel sounds normal; no masses,  no organomegaly Extremities: extremities normal, atraumatic, no cyanosis or edema  ECOG PERFORMANCE STATUS: 0 - Asymptomatic  Blood pressure (!) 143/114, pulse 96, temperature 98.4 F (36.9 C), temperature source Oral, resp. rate 16, weight 204 lb 9.6 oz (92.8 kg), SpO2 98 %.  LABORATORY DATA: Lab Results  Component Value Date   WBC 5.9 06/19/2022   HGB 13.8 06/19/2022   HCT 43.5 06/19/2022   MCV 76.3 (L) 06/19/2022   PLT 175.0 06/19/2022      Chemistry      Component Value Date/Time   NA 139 06/19/2022 1441   NA 138 07/15/2017 1117   K 4.6 06/19/2022 1441   K 4.0 07/15/2017 1117   CL 103 06/19/2022 1441   CO2 26 06/19/2022 1441   CO2 24 07/15/2017 1117   BUN 24 (H) 06/19/2022 1441   BUN 21.8 07/15/2017 1117   CREATININE 1.61 (H) 06/19/2022 1441   CREATININE 1.91 (H) 01/29/2022 1036   CREATININE 1.6 (H) 07/15/2017 1117      Component Value Date/Time   CALCIUM 9.3 06/19/2022 1441   CALCIUM 9.6 07/15/2017 1117   ALKPHOS 63 06/19/2022 1441   ALKPHOS 66 07/15/2017 1117   AST 16 06/19/2022 1441   AST 24 01/29/2022 1036   AST 17 07/15/2017 1117   ALT 13 06/19/2022 1441   ALT 25 01/29/2022 1036   ALT 19 07/15/2017 1117   BILITOT 0.7 06/19/2022 1441   BILITOT 0.9 01/29/2022 1036   BILITOT 0.85 07/15/2017 1117       RADIOGRAPHIC STUDIES: No results found.  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 Our goal is to keep his hematocrit between 45-50%. The patient had an episode of upper gastrointestinal hemorrhage when he was in [place] few months ago. Repeat blood work recently by his primary care physician showed hemoglobin of 13.8 and hematocrit 43.5% with MCV of 76.3. I recommended for the patient to start taking oral iron tablets with Integra +1 capsule p.o. daily for the next few months. For  the history of gastrointestinal hemorrhage, I will give him refill of Nexium. The patient is traveling back to [place] and will be back to the [place] in February 2024. I will see him back for follow-up visit at that time. He was advised to call immediately if he has any other concerning symptoms in the interval. For the hypertension and renal insufficiency, he is seeing his primary care physician Dr. Jenny Reichmann later today.  The patient voices understanding of current disease status and treatment options and is in agreement with the current care plan.  All questions were answered. The patient knows to call the clinic with any problems, questions or concerns. We can certainly see the patient much sooner if necessary.  Disclaimer: This note was dictated with voice recognition software. Similar sounding words can inadvertently be transcribed and may not be corrected upon review.

## 2022-07-22 ENCOUNTER — Ambulatory Visit: Payer: Medicare Other | Admitting: Neurology

## 2022-07-22 ENCOUNTER — Encounter: Payer: Self-pay | Admitting: Neurology

## 2022-07-22 VITALS — BP 161/109 | HR 85 | Ht 67.0 in | Wt 203.8 lb

## 2022-07-22 DIAGNOSIS — G3184 Mild cognitive impairment, so stated: Secondary | ICD-10-CM | POA: Diagnosis not present

## 2022-07-22 DIAGNOSIS — I679 Cerebrovascular disease, unspecified: Secondary | ICD-10-CM | POA: Diagnosis not present

## 2022-07-22 MED ORDER — CEREFOLIN 6-1-50-5 MG PO TABS
1.0000 | ORAL_TABLET | ORAL | 3 refills | Status: AC
Start: 2022-07-22 — End: 2022-07-23

## 2022-07-22 MED ORDER — ASPIRIN 81 MG PO TBEC: 81.0000 mg | DELAYED_RELEASE_TABLET | Freq: Every day | ORAL | 12 refills | Status: AC

## 2022-07-22 NOTE — Progress Notes (Signed)
[facility] [address] [place]. [place] [address] [phone]       OFFICE FOLLOW-UP VISIT NOTE  Bradley Riley Date of Birth:  1956/05/06 Medical Record Number:  [id number]   Referring MD: Loni Dolly  Reason for Referral: Abnormal MRI  HPI: Initial visit 12/25/2021 :Bradley Riley is a 66 year old pleasant [place] male seen today for initial office consultation visit.  Is accompanied by his daughter.  Patient states he had his MRI scheduled by his primary care physician because of his concern for Alzheimer's given his strong family history and his younger sister recently being diagnosed with Alzheimer's.  He himself denies any symptoms of memory difficulties, cognitive impairment, trouble handling his affairs.  He is retired and is totally independent in actives of daily living.  He lives with his daughter and does not need any help.  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  He also had CT angiogram of the brain and neck done on 12/21/2021 which showed moderate distal basilar and right-sided P1-P2 junction and distal left A3 stenosis.  Lab work on 11/13/2021 showed hemoglobin A1c 5.9 and LDL cholesterol 114 mg percent.  Patient did quite well Mini-Mental status testing by my CMA today and scored 30/30 but by my testing he had diminished recall 0/3.  He was able to name 14 animals which walk on 4 legs and clock drawing was 3/4.  He denies any prior known neurological problems including stroke, TIA, seizures or migraines Update 07/22/2022: He returns for follow-up after last visit 7 months ago.  He is accompanied by his daughter.  Patient continues to have mild short-term memory and cognitive difficulties but states he is trivial and nonprogressive full  independent in all activities of daily living.  He had lab work done at last visit on 12/25/2018.  This showed elevated level of 23.9.  He was prescribed folic acid 1 mg but he states he has not started taking it.  Vitamin B12, TSH and RPR were all normal.  Lab work on 06/20/2019 showed hemoglobin A1c of 6.1 and LDL cholesterol of 78 mg percent.  EEG done on 01/06/2022 was normal.  He has not had any stroke or TIA symptoms.  He was started by me on aspirin and Plavix at last visit but while visiting [facility] Riley 2 months later he developed GI bleed and was admitted to the hospital.  Aspirin and Plavix.  Upon GI endoscopy showed only mild capillary bleeding.  He states his blood pressures under good control though today it is elevated at 161/109 in office. He did tolerating cholesterol  well without side effects.  He has no complaints today.   ROS:   14 system review of systems is positive for no complaints today and all other systems negative  PMH:  Past Medical History:  Diagnosis Date   GERD 07/29/2007   Qualifier: Diagnosis of  By: Garen Grams     HEPATITIS A, VIRAL, W/O HEPATIC COMA 07/29/2007   Qualifier: Diagnosis of  By: Garen Grams     History of renal stone 12/29/2013   Hyperlipidemia    HYPERTENSION 07/29/2007   Qualifier: Diagnosis of  By: Garen Grams     HYPERTHYROIDISM 07/29/2007   Qualifier: Diagnosis of  By: Garen Grams     Other and unspecified hyperlipidemia  07/29/2007   Centricity Description: DYSLIPIDEMIA Qualifier: Diagnosis of  By: Garen Grams   Centricity Description: HYPERLIPIDEMIA Qualifier: Diagnosis of  By: Garen Grams     Polycythemia 12/29/2013   UTI (lower urinary tract infection)     Social History:  Social History   Socioeconomic History   Marital status: Married    Spouse name: Not on file   Number of children: Not on file   Years of education: masters   Highest education level: Not on file  Occupational History   Occupation:  Self-Employed  Tobacco Use   Smoking status: Never   Smokeless tobacco: Current  Substance and Sexual Activity   Alcohol use: No   Drug use: No   Sexual activity: Not on file  Other Topics Concern   Not on file  Social History Narrative   Not on file   Social Determinants of Health   Financial Resource Strain: Not on file  Food Insecurity: Not on file  Transportation Needs: Not on file  Physical Activity: Not on file  Stress: Not on file  Social Connections: Not on file  Intimate Partner Violence: Not on file    Medications:   Current Outpatient Medications on File Prior to Visit  Medication Sig Dispense Refill   esomeprazole (NEXIUM) 40 MG capsule Take 1 capsule (40 mg total) by mouth daily at 12 noon. 90 capsule 1   Multiple Vitamins-Minerals (MEMORY VITE PO) Take by mouth.     rosuvastatin (CRESTOR) 5 MG tablet Take 5 mg by mouth daily.     tamsulosin (FLOMAX) 0.4 MG CAPS capsule Take 0.4 mg by mouth daily.     VITAMIN E COMPLEX PO Take by mouth.     zolpidem (AMBIEN) 10 MG tablet Take 1 tablet (10 mg total) by mouth at bedtime as needed for sleep. (Patient not taking: Reported on 01/29/2022) 90 tablet 1   No current facility-administered medications on file prior to visit.    Allergies:  No Known Allergies  Physical Exam General: well developed, well nourished middle-aged [place] male, seated, in no evident distress Head: head normocephalic and atraumatic.   Neck: supple with no carotid or supraclavicular bruits Cardiovascular: regular rate and rhythm, no murmurs Musculoskeletal: no deformity Skin:  no rash/petichiae Vascular:  Normal pulses all extremities  Neurologic Exam Mental Status: Awake and fully alert. Oriented to place and time. Recent and remote memory intact. Attention span, concentration and fund of knowledge appropriate. Mood and affect appropriate.  Diminished recall 2/3  Mini-Mental exam not done today..  Clock drawing 3/4.  Able to name 10 animals  which can walk on 4 legs.   Cranial Nerves: Fundoscopic exam reveals sharp disc margins. Pupils equal, briskly reactive to light. Extraocular movements full without nystagmus. Visual fields full to confrontation. Hearing intact. Facial sensation intact. Face, tongue, palate moves normally and symmetrically.  Motor: Normal bulk and tone. Normal strength in all tested extremity muscles. Sensory.: intact to touch , pinprick , position and vibratory sensation.  Coordination: Rapid alternating movements normal in all extremities. Finger-to-nose and heel-to-shin performed accurately bilaterally. Gait and Station: Arises from chair without difficulty. Stance is normal. Gait demonstrates normal stride length and balance . Able to heel, toe and tandem walk without difficulty.  Reflexes: 1+ and symmetric. Toes downgoing.     ASSESSMENT: 66 year old [place] male with strong family history of Alzheimer's with no obvious clinical symptoms but examination suggest mild cognitive impairment which is likely vascular given abnormal MRI showing multiple lacunar strokes from which  he is apparently not had any clinical or stroke symptoms.  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  He has mild cognitive impairment     PLAN:I had a long discussion with the patient and his daughter regarding results of his lab work to suggest elevated homocystine level.  Taking Cerefolin tablet daily to help with his memory loss.  Also encouraged him to increase participation in cognitively challenging activities like solving crossword puzzles, playing bridge and sudoku.  We also discussed memory compensation strategies.  I recommend he start taking enteric-coated aspirin 81 daily for stroke prevention and aggressive risk factor modification strict control of blood pressure goal below 140/90 and lipids with LDL cholesterol goal below return for follow-up in the future in 6 months  or call earlier if necessary.Greater than 50% time during this prolonged 35-minute  visit was spent in counseling and coordination of care about his abnormal MRI scan of the brain, intracranial stenosis, silent lacunar strokes and mild cognitive impairment and risk for Alzheimer's and answering questions. Antony Contras, MD  Note: This document was prepared with digital dictation and possible smart phrase technology. Any transcriptional errors that result from this process are unintentional.

## 2022-07-22 NOTE — Patient Instructions (Addendum)
I had a long discussion with the patient and his daughter regarding results of his lab work to suggest elevated homocystine level.  Taking Cerefolin tablet daily to help with his memory loss.  Also encouraged him to increase participation in cognitively challenging activities like solving crossword puzzles, playing bridge and sudoku.  We also discussed memory compensation strategies.  I recommend he start taking enteric-coated aspirin 81 daily for stroke prevention and aggressive risk factor modification strict control of blood pressure goal below 140/90 and lipids with LDL cholesterol goal below return for follow-up in the future in 6 months or call earlier if necessary. Memory Compensation Strategies  Use "WARM" strategy.  W= write it down  A= associate it  R= repeat it  M= make a mental note  2.   You can keep a Social worker.  Use a 3-ring notebook with sections for the following: calendar, important names and phone numbers,  medications, doctors' names/phone numbers, lists/reminders, and a section to journal what you did  each day.   3.    Use a calendar to write appointments down.  4.    Write yourself a schedule for the day.  This can be placed on the calendar or in a separate section of the Memory Notebook.  Keeping a  regular schedule can help memory.  5.    Use medication organizer with sections for each day or morning/evening pills.  You may need help loading it  6.    Keep a basket, or pegboard by the door.  Place items that you need to take out with you in the basket or on the pegboard.  You may also want to  include a message board for reminders.  7.    Use sticky notes.  Place sticky notes with reminders in a place where the task is performed.  For example: " turn off the  stove" placed by the stove, "lock the door" placed on the door at eye level, " take your medications" on  the bathroom mirror or by the place where you normally take your medications.  8.    Use  alarms/timers.  Use while cooking to remind yourself to check on food or as a reminder to take your medicine, or as a  reminder to make a call, or as a reminder to perform another task, etc.

## 2022-07-23 ENCOUNTER — Telehealth: Payer: Self-pay | Admitting: Neurology

## 2022-07-23 NOTE — Telephone Encounter (Signed)
[facility] is calling to verify that it's 1 time a day, requesting a return call from the nurse.

## 2022-07-23 NOTE — Telephone Encounter (Signed)
I spoke with the pharmacy to confirm patient's vitamin was to be taken once daily. Rx has been clarified.

## 2022-07-28 ENCOUNTER — Encounter: Payer: Self-pay | Admitting: Internal Medicine

## 2022-08-06 ENCOUNTER — Ambulatory Visit: Payer: BLUE CROSS/BLUE SHIELD | Admitting: Internal Medicine

## 2022-10-13 ENCOUNTER — Telehealth: Payer: Self-pay | Admitting: Internal Medicine

## 2022-10-13 NOTE — Telephone Encounter (Signed)
LVM for pt to rtn my call to schedule AWV with [facility] call back # [phone]

## 2022-12-25 ENCOUNTER — Telehealth: Payer: Self-pay | Admitting: Neurology

## 2022-12-25 NOTE — Telephone Encounter (Signed)
LVM and sent mychart msg informing pt of r/s needed for 2/22 appt- MD out.

## 2023-01-07 ENCOUNTER — Telehealth: Payer: Self-pay

## 2023-01-07 NOTE — Telephone Encounter (Signed)
Left message for patient to call back to schedule Medicare Annual Wellness Visit.  No hx of AWV eligible as of 01/01/22  Please schedule at anytime with [facility] if patient calls the office back.    30 minute appointment.  Any questions, please call me at [phone]

## 2023-01-22 ENCOUNTER — Ambulatory Visit: Payer: Medicare Other | Admitting: Neurology

## 2023-04-01 DIAGNOSIS — G7000 Myasthenia gravis without (acute) exacerbation: Secondary | ICD-10-CM | POA: Diagnosis not present

## 2023-04-01 DIAGNOSIS — E785 Hyperlipidemia, unspecified: Secondary | ICD-10-CM | POA: Diagnosis not present

## 2023-04-01 DIAGNOSIS — I6381 Other cerebral infarction due to occlusion or stenosis of small artery: Secondary | ICD-10-CM | POA: Diagnosis not present

## 2023-04-01 DIAGNOSIS — G309 Alzheimer's disease, unspecified: Secondary | ICD-10-CM | POA: Diagnosis not present

## 2023-04-01 DIAGNOSIS — E559 Vitamin D deficiency, unspecified: Secondary | ICD-10-CM | POA: Diagnosis not present

## 2023-04-01 DIAGNOSIS — Z13820 Encounter for screening for osteoporosis: Secondary | ICD-10-CM | POA: Diagnosis not present

## 2023-04-01 DIAGNOSIS — I10 Essential (primary) hypertension: Secondary | ICD-10-CM | POA: Diagnosis not present

## 2023-04-01 DIAGNOSIS — E119 Type 2 diabetes mellitus without complications: Secondary | ICD-10-CM | POA: Diagnosis not present

## 2023-05-06 DIAGNOSIS — R319 Hematuria, unspecified: Secondary | ICD-10-CM | POA: Diagnosis not present

## 2023-05-06 DIAGNOSIS — R8271 Bacteriuria: Secondary | ICD-10-CM | POA: Diagnosis not present

## 2023-07-17 IMAGING — DX DG HIP (WITH OR WITHOUT PELVIS) 5+V BILAT
5 series · 5 of 5 positions shown · non-contrast
Comparison: None similar

CLINICAL DATA: Atraumatic bilateral groin pain for 2 days

EXAM:
DG HIP (WITH OR WITHOUT PELVIS) 5+V BILAT

[pelvis ap]
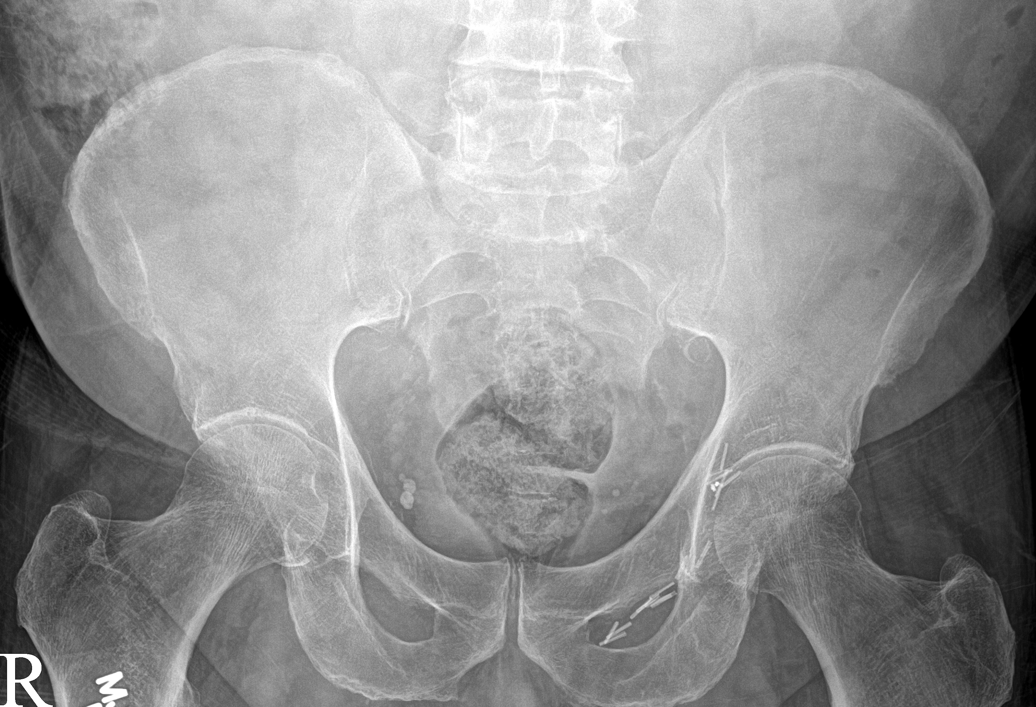

[hip ap]
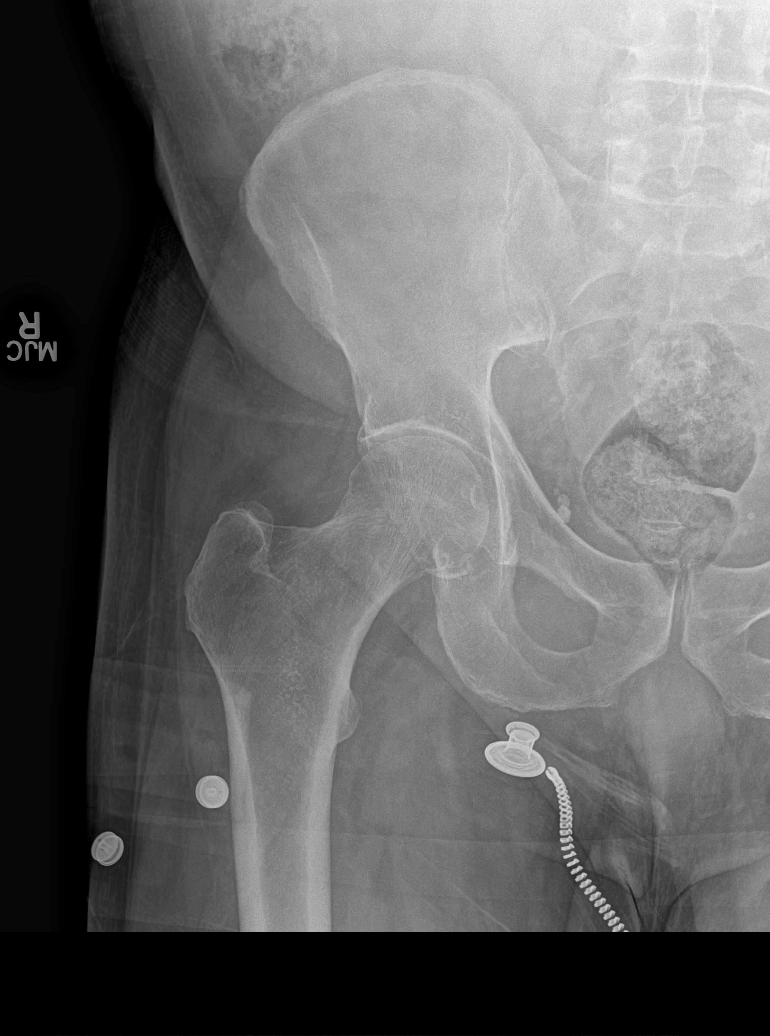

[hip frog leg (1 of 3)]
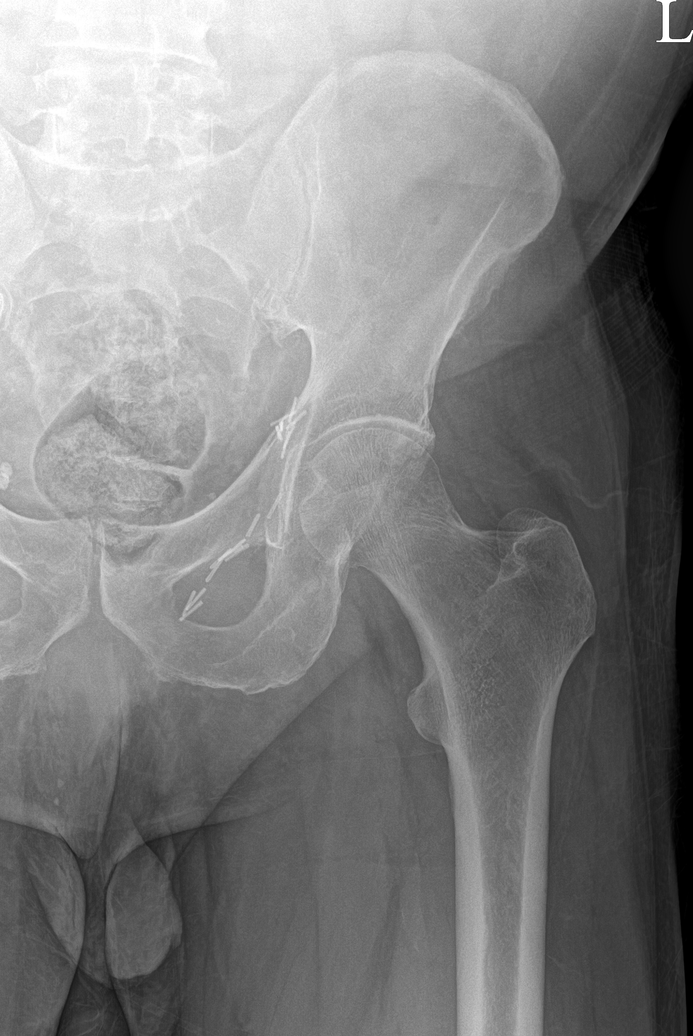

[hip frog leg (2 of 3)]
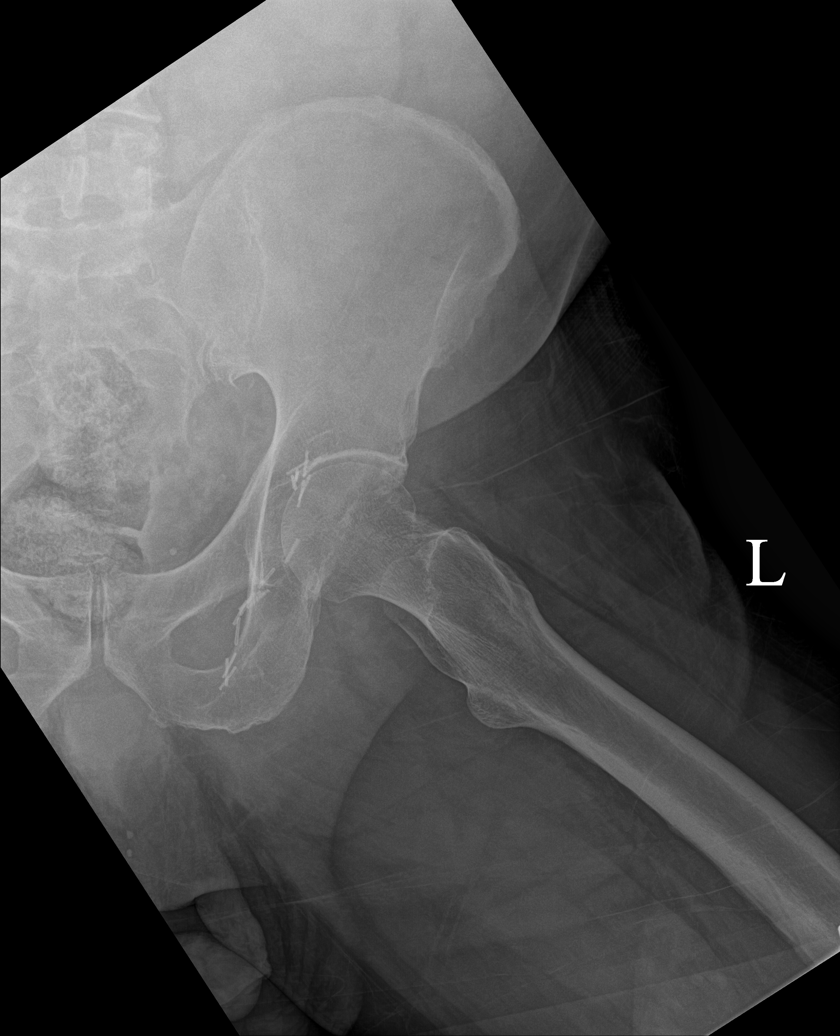

[hip frog leg (3 of 3)]
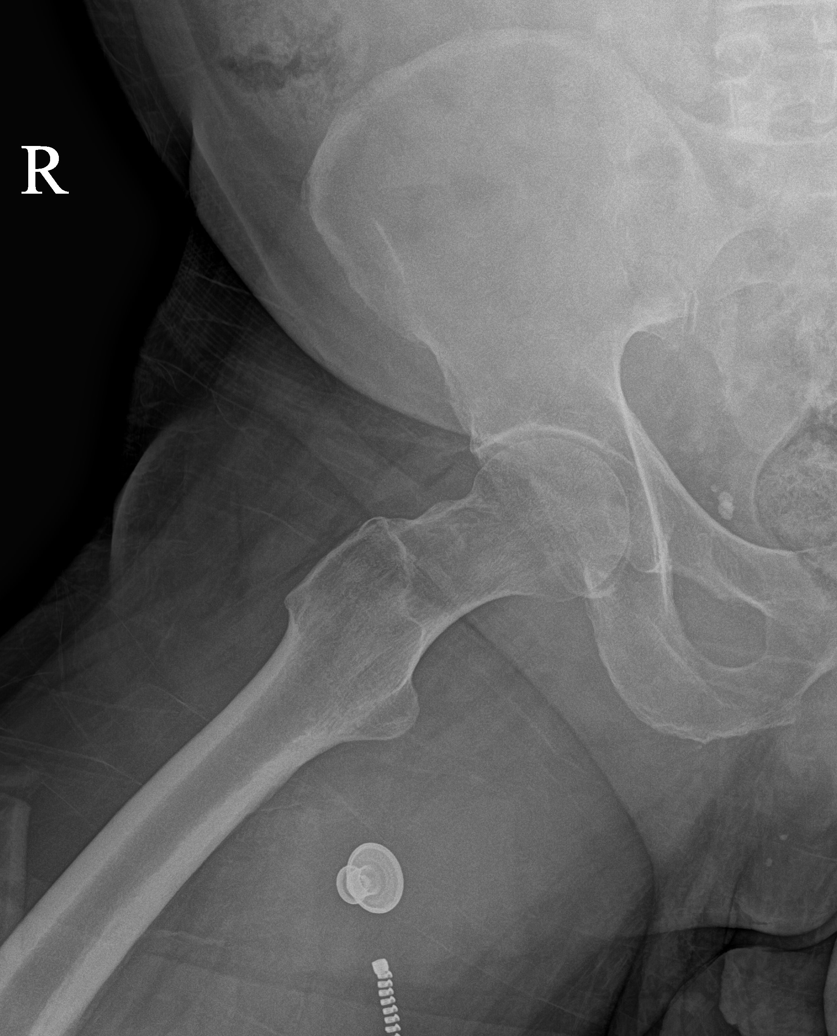

[5 of 5 positions shown; findings below may reference images not displayed]

FINDINGS: Negative for fracture, erosion, or malalignment. No hip space
narrowing or notable spurring. Postoperative left groin. Pelvic
calcifications which are arterial and venous appearing
IMPRESSION: Unremarkable appearance of the hips.

## 2024-01-25 DIAGNOSIS — Z125 Encounter for screening for malignant neoplasm of prostate: Secondary | ICD-10-CM | POA: Diagnosis not present

## 2024-01-25 DIAGNOSIS — Z13220 Encounter for screening for lipoid disorders: Secondary | ICD-10-CM | POA: Diagnosis not present

## 2024-06-17 ENCOUNTER — Encounter: Payer: Self-pay | Admitting: Advanced Practice Midwife
